# Patient Record
Sex: Female | Born: 1970 | Race: White | Hispanic: No | Marital: Married | Smoking: Never smoker
Health system: Southern US, Community
[De-identification: ages and names within clinical notes are randomized; demographics above are authoritative.]

## PROBLEM LIST (undated history)

## (undated) DIAGNOSIS — M797 Fibromyalgia: Secondary | ICD-10-CM

## (undated) HISTORY — PX: OOPHORECTOMY: SHX86

## (undated) HISTORY — PX: INTRAUTERINE DEVICE (IUD) INSERTION: SHX5877

## (undated) HISTORY — PX: FACIAL RECONSTRUCTION SURGERY: SHX631

---

## 1999-01-25 ENCOUNTER — Inpatient Hospital Stay (HOSPITAL_COMMUNITY): Admission: AD | Admit: 1999-01-25 | Discharge: 1999-01-27 | Payer: Self-pay | Admitting: Obstetrics & Gynecology

## 2000-02-03 ENCOUNTER — Other Ambulatory Visit: Admission: RE | Admit: 2000-02-03 | Discharge: 2000-02-03 | Payer: Self-pay | Admitting: *Deleted

## 2001-02-16 ENCOUNTER — Other Ambulatory Visit: Admission: RE | Admit: 2001-02-16 | Discharge: 2001-02-16 | Payer: Self-pay | Admitting: *Deleted

## 2002-04-24 ENCOUNTER — Other Ambulatory Visit: Admission: RE | Admit: 2002-04-24 | Discharge: 2002-04-24 | Payer: Self-pay | Admitting: *Deleted

## 2004-10-16 ENCOUNTER — Other Ambulatory Visit: Admission: RE | Admit: 2004-10-16 | Discharge: 2004-10-16 | Payer: Self-pay | Admitting: Obstetrics and Gynecology

## 2006-02-09 ENCOUNTER — Other Ambulatory Visit: Admission: RE | Admit: 2006-02-09 | Discharge: 2006-02-09 | Payer: Self-pay | Admitting: Family Medicine

## 2009-12-17 ENCOUNTER — Ambulatory Visit: Payer: Self-pay | Admitting: Gynecology

## 2010-01-15 ENCOUNTER — Ambulatory Visit
Admission: RE | Admit: 2010-01-15 | Discharge: 2010-01-15 | Payer: Self-pay | Source: Home / Self Care | Attending: Gynecology | Admitting: Gynecology

## 2010-01-20 ENCOUNTER — Ambulatory Visit
Admission: RE | Admit: 2010-01-20 | Discharge: 2010-01-20 | Payer: Self-pay | Source: Home / Self Care | Attending: Gynecology | Admitting: Gynecology

## 2010-01-20 HISTORY — PX: PELVIC LAPAROSCOPY: SHX162

## 2010-01-23 NOTE — Op Note (Signed)
NAMECORIANNA, AVALLONE            ACCOUNT NO.:  [id number]  MEDICAL RECORD NO.:  [id number]          PATIENT TYPE:  AMB  LOCATION:  [facility]                         FACILITY:  [facility]  PHYSICIAN:  Juan H. Lily Peer, M.D.DATE OF BIRTH:  11/16/1970  DATE OF PROCEDURE: DATE OF DISCHARGE:                              OPERATIVE REPORT   SURGEON:  Juan H. Lily Peer, M.D.  FIRST ASSISTANT:  Timothy P. Fontaine, M.D.  INDICATION FOR OPERATION:  [age]-year-old gravida 2, para 2 with a left adnexal mass.  PREOPERATIVE DIAGNOSIS:  Left adnexal mass.  POSTOPERATIVE DIAGNOSIS:  Left dermoid cyst.  ANESTHESIA:  General endotracheal anesthesia.  PROCEDURES PERFORMED: 1. Diagnostic laparoscopy. 2. Pelvic washing. 3. Left salpingo-oophorectomy.  FINDINGS:  Patient with an 8-1/2 cm smooth surface cyst, freely mobile with no excrescences.  Anterior and posterior cul-de-sacs were free of adhesions or endometriotic implants.  Her right tube and ovary were otherwise normal.  DESCRIPTION OF OPERATION:  The patient was adequately counseled.  She was taken to the operating room where she underwent a successful general endotracheal anesthesia.  Patient had received a gram of Ancef IV.  She was placed in low lithotomy position.  The abdomen, vagina and perineum were prepped and draped in the usual sterile fashion.  An Hulka tenaculum was placed for manipulation during the laparoscopic procedure. A Foley catheter had also been inserted to respond to urinary output.  A small subumbilical incision was made in a semilunar fashion and the Optiview 10-04/11 mm trocar was introduced into the abdominal cavity. After adequate entrance into the peritoneal cavity, pneumoperitoneum was established whereby 2.5 to 3 liters of carbon dioxide were insufflated into the peritoneal cavity.  Under laparoscopic guidance, two additional 5-mm ports were made in the patient's right and left lower abdomen under laparoscopic  guidance.  She was then placed in Trendelenburg position after pelvic washings had been obtained and inspection of the entire pelvic cavity demonstrating normal anterior and posterior cul-de-sac, normal right tube and ovary and a left dermoid cyst approximately 8.5 cm in diameter with no excrescences and smooth.  The left ureter was identified and the left tube and ovary was placed on detention and the left infundibulopelvic ligament was identified and with Harmonic scalpel, the left infundibulopelvic ligament was coapted and transected. The left utero-ovarian ligament was coapted and transected as well as the proximal fallopian tube and the remainder of the mesosalpinx was coapted and transected to free the left tube and ovary.  Attempt was made to aspirate the cyst content and milky like material, for approximately 15 mL was aspirated and sent off for cytological evaluation.  The 5-mm hysteroscope was inserted in the patient's right lower port and the endoscopic retrieval basket was introduced through the center port and under laparoscopic guidance, the left tube and ovary were placed into the Endopouch and retrieved through the umbilical incision and submitted for histological evaluation.  The pelvic cavity was then copiously irrigated with normal saline solution for approximately a liter to dilute any of the mucinous material and the mesosalpinx small area was bleeding and was contained with the Kleppinger forceps and cauterization and Surgicel was placed for  additional hemostasis.  After the pressure was released, there was no evidence of any bleeding.  The pneumoperitoneum was released and both 5- mm trocars were removed as well as the subumbilical trocar as well.  The patient was then straightened from her Trendelenburg position.  The subumbilical fascia was closed with the running stitch of 3-0 Vicryl suture.  The subcutaneous tissue was reapproximated with 3-0 Vicryl suture as  well and the skin was reapproximated with interrupted sutures of 4-0 plain catgut suture as well as the 5 mm ports.  For postoperative analgesia, 0.25% Marcaine was infiltrated in all three port sites for a total of 10 mL and pressure dressings were placed in all three port sites and the Hulka tenaculum was removed.  The patient was extubated and transferred to recovery room with stable vital signs.  Blood loss from procedure was minimal.  IV fluids have been 100 mL of lactated Ringer's.  Urine output was 200 mL and clear.  She received a gram of Ancef 1 gram IV.     Juan H. Lily Peer, M.D.     JHF/MEDQ  D:  01/20/2010  T:  01/20/2010  Job:  [id number]  Electronically Signed by Reynaldo Minium M.D. on 01/23/2010 05:15:09 PM

## 2010-02-03 ENCOUNTER — Ambulatory Visit
Admission: RE | Admit: 2010-02-03 | Discharge: 2010-02-03 | Payer: Self-pay | Source: Home / Self Care | Attending: Gynecology | Admitting: Gynecology

## 2010-03-04 ENCOUNTER — Encounter (INDEPENDENT_AMBULATORY_CARE_PROVIDER_SITE_OTHER): Payer: BC Managed Care – PPO | Admitting: Gynecology

## 2010-03-04 ENCOUNTER — Other Ambulatory Visit (HOSPITAL_COMMUNITY)
Admission: RE | Admit: 2010-03-04 | Discharge: 2010-03-04 | Disposition: A | Discharge status: Home / Self Care | Payer: BC Managed Care – PPO | Source: Ambulatory Visit | Attending: Gynecology | Admitting: Gynecology

## 2010-03-04 ENCOUNTER — Other Ambulatory Visit: Payer: Self-pay | Admitting: Gynecology

## 2010-03-04 DIAGNOSIS — N83209 Unspecified ovarian cyst, unspecified side: Secondary | ICD-10-CM

## 2010-03-04 DIAGNOSIS — Z124 Encounter for screening for malignant neoplasm of cervix: Secondary | ICD-10-CM | POA: Insufficient documentation

## 2010-03-31 ENCOUNTER — Other Ambulatory Visit (INDEPENDENT_AMBULATORY_CARE_PROVIDER_SITE_OTHER): Payer: BC Managed Care – PPO

## 2010-03-31 DIAGNOSIS — Z13220 Encounter for screening for lipoid disorders: Secondary | ICD-10-CM

## 2010-03-31 DIAGNOSIS — Z833 Family history of diabetes mellitus: Secondary | ICD-10-CM

## 2010-03-31 DIAGNOSIS — R635 Abnormal weight gain: Secondary | ICD-10-CM

## 2011-03-06 ENCOUNTER — Encounter: Payer: BC Managed Care – PPO | Admitting: Gynecology

## 2011-03-18 ENCOUNTER — Encounter: Payer: BC Managed Care – PPO | Admitting: Gynecology

## 2011-03-31 ENCOUNTER — Other Ambulatory Visit: Payer: Self-pay | Admitting: Gynecology

## 2011-03-31 ENCOUNTER — Encounter: Payer: Self-pay | Admitting: Gynecology

## 2011-03-31 ENCOUNTER — Ambulatory Visit (INDEPENDENT_AMBULATORY_CARE_PROVIDER_SITE_OTHER): Payer: BC Managed Care – PPO | Admitting: Gynecology

## 2011-03-31 VITALS — BP 128/80 | Ht 65.75 in | Wt 174.0 lb

## 2011-03-31 DIAGNOSIS — N926 Irregular menstruation, unspecified: Secondary | ICD-10-CM

## 2011-03-31 DIAGNOSIS — N938 Other specified abnormal uterine and vaginal bleeding: Secondary | ICD-10-CM | POA: Insufficient documentation

## 2011-03-31 DIAGNOSIS — Z01419 Encounter for gynecological examination (general) (routine) without abnormal findings: Secondary | ICD-10-CM

## 2011-03-31 DIAGNOSIS — L659 Nonscarring hair loss, unspecified: Secondary | ICD-10-CM

## 2011-03-31 DIAGNOSIS — R635 Abnormal weight gain: Secondary | ICD-10-CM | POA: Insufficient documentation

## 2011-03-31 LAB — URINALYSIS W MICROSCOPIC + REFLEX CULTURE
Crystals: NONE SEEN
Ketones, ur: NEGATIVE mg/dL
Nitrite: NEGATIVE
Specific Gravity, Urine: 1.025 (ref 1.005–1.030)
Urobilinogen, UA: 0.2 mg/dL (ref 0.0–1.0)

## 2011-03-31 MED ORDER — MEGESTROL ACETATE 40 MG PO TABS: 40.0000 mg | ORAL_TABLET | Freq: Two times a day (BID) | ORAL | Status: DC

## 2011-03-31 NOTE — Patient Instructions (Signed)

## 2011-03-31 NOTE — Progress Notes (Signed)
Lindsey Chan 1970-07-06 [id number]   History:    41 y.o.  for annual exam gravida 2 para 2 who presented to the office today for her annual gynecological examination and had complained of dysfunction uterine bleeding. Her husband has had a vasectomy. She stated she's been spotting on and off now for the past month. Her last mammogram February 2012 which was normal. She does her monthly self breast examination.  Past medical history,surgical history, family history and social history were all reviewed and documented in the EPIC chart.  Gynecologic History Patient's last menstrual period was 03/02/2011. Contraception: vasectomy Last Pap: 2012. Results were: normal Last mammogram: 2012. Results were: normal  Obstetric History OB History    Grav Para Term Preterm Abortions TAB SAB Ect Mult Living   2 2 2       2      # Outc Date GA Lbr Len/2nd Wgt Sex Del Anes PTL Lv   1 TRM     F SVD  No Yes   2 TRM     F SVD  No Yes       ROS:  Was performed and pertinent positives and negatives are included in the history.  Exam: chaperone present  BP 128/80  Ht 5' 5.75" (1.67 m)  Wt 174 lb (78.926 kg)  BMI 28.30 kg/m2  LMP 03/02/2011  Body mass index is 28.30 kg/(m^2).  General appearance : Well developed well nourished female. No acute distress HEENT: Neck supple, trachea midline, no carotid bruits, no thyroidmegaly Lungs: Clear to auscultation, no rhonchi or wheezes, or rib retractions  Heart: Regular rate and rhythm, no murmurs or gallops Breast:Examined in sitting and supine position were symmetrical in appearance, no palpable masses or tenderness,  no skin retraction, no nipple inversion, no nipple discharge, no skin discoloration, no axillary or supraclavicular lymphadenopathy Abdomen: no palpable masses or tenderness, no rebound or guarding Extremities: no edema or skin discoloration or tenderness  Pelvic:  Bartholin, Urethra, Skene Glands: Within normal limits  Vagina: No gross lesions or discharge  Cervix: No gross lesions or discharge  Uterus  anteverted, normal size, shape and consistency, non-tender and mobile  Adnexa  Without masses or tenderness  Anus and perineum  normal   Rectovaginal  normal sphincter tone without palpated masses or tenderness             Hemoccult not done   The patient was counseled for an endometrial biopsy. The cervix was cleansed with Betadine solution a Pipelle was introduced into the intrauterine cavity. The uterus sounded to 8 cm and moderate amount of tissue was obtained and submitted for histological evaluation. Of note urine pregnancy test was negative.  Assessment/Plan:  41 y.o. female for annual exam with intermenstrual bleeding. Endometrial biopsy done today pathology report pending at time of this dictation. Patient will be prescribed Megace 40 mg twice a day for 5-7 days if she begins to bleed again. She'll return back next week for sonohysterogram to rule out any intracavitary defects. Requisition to schedule her mammogram was provided. She'll stop by the lab to get the following lab work: TSH, blood sugar, cholesterol, CBC, and urinalysis. We discussed a new Pap smear screening guidelines and she would need 1 for 2 more years.    Ok Edwards MD, 4:30 PM 03/31/2011

## 2011-04-01 LAB — CBC WITH DIFFERENTIAL/PLATELET
Basophils Relative: 1 % (ref 0–1)
Hemoglobin: 11.1 g/dL — ABNORMAL LOW (ref 12.0–15.0)
Lymphocytes Relative: 26 % (ref 12–46)
Lymphs Abs: 2.1 10*3/uL (ref 0.7–4.0)
Monocytes Relative: 8 % (ref 3–12)
Neutro Abs: 4.9 10*3/uL (ref 1.7–7.7)
Neutrophils Relative %: 60 % (ref 43–77)
RBC: 4.32 MIL/uL (ref 3.87–5.11)
WBC: 8.1 10*3/uL (ref 4.0–10.5)

## 2011-04-01 LAB — GLUCOSE, RANDOM: Glucose, Bld: 90 mg/dL (ref 70–99)

## 2011-04-01 LAB — TSH: TSH: 3.266 u[IU]/mL (ref 0.350–4.500)

## 2011-04-01 LAB — CHOLESTEROL, TOTAL: Cholesterol: 147 mg/dL (ref 0–200)

## 2011-04-02 LAB — URINE CULTURE
Colony Count: NO GROWTH
Organism ID, Bacteria: NO GROWTH

## 2011-04-06 ENCOUNTER — Telehealth: Payer: Self-pay | Admitting: *Deleted

## 2011-04-06 NOTE — Telephone Encounter (Signed)
Pt called asking when should she take the megace 40 mg given on last office visit 03/31/11. Pt informed per office note take medication once bleeding starts.

## 2011-04-09 ENCOUNTER — Telehealth: Payer: Self-pay | Admitting: *Deleted

## 2011-04-09 NOTE — Telephone Encounter (Signed)
Pt informed with the below note. 

## 2011-04-09 NOTE — Telephone Encounter (Signed)
Pt has [facility] scheduled on 04/10/11, she was given Megace 40 mg twice a day for 5-7 days if she begins to bleed again. Bleeding starting on Tuesday afternoon mild flow. Pt wants to know if you want her to keep appointment or reschedule?

## 2011-04-09 NOTE — Telephone Encounter (Signed)
If she has any Megace left tell her to take 1 tablet twice a day for 5 more days if not we'll call in for her. She still should come in tomorrow for her sonohysterogram

## 2011-04-10 ENCOUNTER — Ambulatory Visit (INDEPENDENT_AMBULATORY_CARE_PROVIDER_SITE_OTHER): Payer: BC Managed Care – PPO

## 2011-04-10 ENCOUNTER — Other Ambulatory Visit: Payer: BC Managed Care – PPO

## 2011-04-10 ENCOUNTER — Ambulatory Visit: Payer: BC Managed Care – PPO | Admitting: Gynecology

## 2011-04-10 ENCOUNTER — Ambulatory Visit (INDEPENDENT_AMBULATORY_CARE_PROVIDER_SITE_OTHER): Payer: BC Managed Care – PPO | Admitting: Gynecology

## 2011-04-10 DIAGNOSIS — R1032 Left lower quadrant pain: Secondary | ICD-10-CM

## 2011-04-10 DIAGNOSIS — N938 Other specified abnormal uterine and vaginal bleeding: Secondary | ICD-10-CM

## 2011-04-10 DIAGNOSIS — N939 Abnormal uterine and vaginal bleeding, unspecified: Secondary | ICD-10-CM

## 2011-04-10 DIAGNOSIS — N840 Polyp of corpus uteri: Secondary | ICD-10-CM

## 2011-04-10 DIAGNOSIS — N926 Irregular menstruation, unspecified: Secondary | ICD-10-CM

## 2011-04-10 NOTE — Progress Notes (Signed)
Patient is a 41 year old gravida 2 para 2 who was seen in the office on March 26 at time of her annual exam she did complain dysfunction uterine bleeding her husband had a vasectomy. She had an endometrial biopsy done during that office visit with pathology report as follows:  Endometrium, biopsy SECRETORY ENDOMETRIUM AND BENIGN ENDOMETRIAL POLYP. NO HYPERPLASIA OR CARCINOMA.  Patient presented to the office today for sonohysterogram for further evaluation. Ultrasound report as follows:  Uterus measured 10.4 x 6.0 x 5.6 cm with endometrial stripe 18.3 mm (patient currently on menses). Right and left adnexa otherwise normal several polyps were noted as follows: 17 x 10 mm, 21 x 10 mm, non-by 5 mm.  The above findings were discussed with the patient she is going to go on vacation this month. We'll schedule for a resectoscopic polypectomy and endometrial ablation 2 weeks after the start of her cycle in May. We'll place her on Prometrium 200 mg one by mouth daily before the procedure. She will see me in the office the week before surgery for preoperative consultation. Literature information was provided and we'll follow accordingly.

## 2011-04-13 ENCOUNTER — Telehealth: Payer: Self-pay

## 2011-04-13 NOTE — Telephone Encounter (Signed)
Left message patient to call me to see if she when she anticipates May period so we can consider scheduling her outpatient surgery.

## 2011-04-20 ENCOUNTER — Telehealth: Payer: Self-pay | Admitting: *Deleted

## 2011-04-20 DIAGNOSIS — N938 Other specified abnormal uterine and vaginal bleeding: Secondary | ICD-10-CM

## 2011-04-20 MED ORDER — MEGESTROL ACETATE 40 MG PO TABS: 40.0000 mg | ORAL_TABLET | Freq: Two times a day (BID) | ORAL | Status: DC

## 2011-04-20 MED ORDER — HYDROCODONE-ACETAMINOPHEN 5-500 MG PO TABS: ORAL_TABLET | ORAL | Status: DC

## 2011-04-20 NOTE — Telephone Encounter (Signed)
Spoke with pt regarding the below. Will pt be on megace 40 mg daily twice daily?

## 2011-04-20 NOTE — Telephone Encounter (Signed)
Pt is to a resectoscopic polypectomy and endometrial ablation 2 weeks after the start of her cycle in May. Pt is c/o bleeding an cramping, lmp:04/07/11 she will be leaving out of town for [place] for 1 week. Pt was giving megace 40 mg twice daily. Pt would like to have something to relieve the pain & cramping. She also wanted to know if her surgery could be pushed to earlier date, I told pt i wasn't sure if this was possible, she told me to ask you. Please advise

## 2011-04-20 NOTE — Telephone Encounter (Signed)
Please tell patient to contact Lindsey Chan to see if we can remove her ablation before. We did call in the Megace to help with her bleeding. For her cramps she can take Lortab 5/501 by mouth Q4 to 6 hours when necessary #30 no refills

## 2011-04-20 NOTE — Telephone Encounter (Signed)
rx sent, Lortab 5/500 called in.

## 2011-04-20 NOTE — Telephone Encounter (Signed)
Megace 40MG  BID for 10 days # 20

## 2011-04-23 ENCOUNTER — Telehealth: Payer: Self-pay | Admitting: Gynecology

## 2011-04-23 DIAGNOSIS — N938 Other specified abnormal uterine and vaginal bleeding: Secondary | ICD-10-CM

## 2011-04-23 MED ORDER — MEGESTROL ACETATE 40 MG PO TABS: 40.0000 mg | ORAL_TABLET | Freq: Two times a day (BID) | ORAL | Status: DC

## 2011-04-23 NOTE — Telephone Encounter (Signed)
Patient given Dr. Glenetta Hew instructions below regarding Lindsey Chan.  I did not see RX in system so I added a refill to her RX.  I told patient that surgery will be 5/2 or 5/3 but I am still trying to coordinate it and will call her in the morning to confirm date/time. (Waiting to hear from [facility] rep regarding his availability.)

## 2011-04-23 NOTE — Telephone Encounter (Signed)
You had asked me to schedule Resectoscopic Polypectomy and Vaportrode Ablation.  Your order said 2nd week of May because you thought her period would start first week and you wanted her on Prometrium x 2 wks prior.    Meantime, she took Megace for one week Apr 1-5 for bleeding. Had [facility] on 4/5 and you told her to d/c the Megace at that appt.  She has spotted or bled every day since. She said this week is heavier with cramps and might possibly be her actual period.  However, she is leaving for [facility] Saturday until the following Saturday.  Husband with a vasectomy and she said pregnancy not a concern.  She has Megace on hand that she was saving in case her bleeding became terrible while at [facility].   We are wanting to schedule her on the Thursday or Friday of the week she returns (May 2 or May 3).  Wanted to be sure that sounds okay with you.  Also, do you still want to do  Prometrium for two weeks before?  She needs to be sure whatever she is on will help slow her flow if it is heavy while away.  Pls advise and thanks.

## 2011-04-23 NOTE — Telephone Encounter (Signed)
Patient can stay on Megace 40 mg twice a day until she stops bleeding and then she can take 1 daily until her surgery and will not need the Prometrium then. If she starts bleeding on 1 tablet daily she should just go back to one tablet twice a day until surgery. Prescribed 30 one refill

## 2011-05-04 ENCOUNTER — Encounter (HOSPITAL_COMMUNITY): Payer: Self-pay | Admitting: Pharmacist

## 2011-05-05 ENCOUNTER — Ambulatory Visit (INDEPENDENT_AMBULATORY_CARE_PROVIDER_SITE_OTHER): Payer: BC Managed Care – PPO | Admitting: Gynecology

## 2011-05-05 ENCOUNTER — Encounter: Payer: Self-pay | Admitting: Gynecology

## 2011-05-05 VITALS — BP 120/80

## 2011-05-05 DIAGNOSIS — Z01818 Encounter for other preprocedural examination: Secondary | ICD-10-CM

## 2011-05-05 NOTE — Progress Notes (Signed)
Patient ID: Lei Guglielmo, female   DOB: 10/30/1970, 41 y.o.   MRN: [id number] Addylynn Segreto is a 41-year-old gravida 2 para 2 who presented to the office today for preoperative consultation who was seen in the office on March 26 at time of her annual exam she had been complaining of dysfunctional  uterine bleeding ( her husband had a vasectomy). She had an endometrial biopsy done during that office visit with pathology report as follows:   Endometrium, biopsy  SECRETORY ENDOMETRIUM AND BENIGN ENDOMETRIAL POLYP. NO HYPERPLASIA OR  CARCINOMA.   Patient had a sonohysterogram on 04/10/2011 with the following findings:   Uterus measured 10.4 x 6.0 x 5.6 cm with endometrial stripe 18.3 mm (patient currently on menses). Right and left adnexa otherwise normal several polyps were noted as follows: 17 x 10 mm, 21 x 10 mm, non-by 5 mm.  Patient scheduled to undergo resectoscopic polypectomy and endometrial ablation on Thursday, May 2   Pertinent Gynecological History: Menses: See above Bleeding: intermenstrual bleeding Contraception: vasectomy DES exposure: denies Blood transfusions: none Sexually transmitted diseases: no past history Previous GYN Procedures: Laparoscopic left salpingo-oophorectomy for mature teratoma  Last mammogram: ? Date: ? Last pap: normal Date: 2012 OB History: G2, P2   Menstrual History: Menarche age: 11 Patient's last menstrual period was 04/20/2011.    Past Medical History  Diagnosis Date  . Asthma     Past Surgical History  Procedure Date  . Facial reconstruction surgery     BROKEN CHEEK  . 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 Hypertension Mother     Social History:  reports that she has never smoked. She has never used smokeless tobacco. She reports that she does not drink alcohol or use illicit drugs.  Allergies:  Allergies  Allergen Reactions  . Clarithromycin Shortness Of  Breath  . Naproxen Shortness Of Breath  . Amoxicillin Rash     (Not in a hospital admission)  REVIEW OF SYSTEMS: A ROS was performed and pertinent positives and negatives are included in the history.  GENERAL: No fevers or chills. HEENT: No change in vision, no earache, sore throat or sinus congestion. NECK: No pain or stiffness. CARDIOVASCULAR: No chest pain or pressure. No palpitations. PULMONARY: No shortness of breath, cough or wheeze. GASTROINTESTINAL: No abdominal pain, nausea, vomiting or diarrhea, melena or bright red blood per rectum. GENITOURINARY: No urinary frequency, urgency, hesitancy or dysuria. MUSCULOSKELETAL: No joint or muscle pain, no back pain, no recent trauma. DERMATOLOGIC: No rash, no itching, no lesions. ENDOCRINE: No polyuria, polydipsia, no heat or cold intolerance. No recent change in weight. HEMATOLOGICAL: No anemia or easy bruising or bleeding. NEUROLOGIC: No headache, seizures, numbness, tingling or weakness. PSYCHIATRIC: No depression, no loss of interest in normal activity or change in sleep pattern.     Blood pressure 120/80, last menstrual period 04/20/2011.  Physical Exam:  HEENT:unremarkable Neck:Supple, midline, no thyroid megaly, no carotid bruits Lungs:  Clear to auscultation no rhonchi's or wheezes Heart:Regular rate and rhythm, no murmurs or gallops Breast Exam: Not examined today Abdomen: Soft nontender no rebound guarding Pelvic:BUS within normal limits Vagina: No lesions or discharge Cervix: No lesions or discharge Uterus: Anteverted normal size shape and consistency Adnexa: No palpable masses or tenderness Extremities: No cords, no edema Rectal: Not examined  No results found for this or any previous visit (from the past 24 hour(s)).  No results found.  Assessment/Plan: Patient   scheduled to undergo resectoscopic polypectomy along with endometrial ablation on Thursday, May 2 1 PM the following risks were discussed with the patient:                         Patient was counseled as to the risk of surgery to include the following:  1. Infection (prohylactic antibiotics will be administered)  2. DVT/Pulmonary Embolism (prophylactic pneumo compression stockings will be used)  3.Trauma to internal organs requiring additional surgical procedure to repair any injury to     Internal organs requiring perhaps additional hospitalization days.  4.Hemmorhage requiring transfusion and blood products which carry risks such as             anaphylactic reaction, hepatitis and AIDS  Patient had received literature information on the procedure scheduled and all her questions were answered and accepts all risk.  Thurza Kwiecinski HMD4:44 PMTD@   Jaycie Kregel H 05/05/2011, 4:38 PM    

## 2011-05-05 NOTE — Patient Instructions (Signed)
We will see you on Thursday.

## 2011-05-06 MED ORDER — CIPROFLOXACIN IN D5W 400 MG/200ML IV SOLN
400.0000 mg | INTRAVENOUS | Status: DC
  Filled 2011-05-06: qty 200

## 2011-05-06 MED ORDER — CLINDAMYCIN PHOSPHATE 900 MG/50ML IV SOLN
900.0000 mg | INTRAVENOUS | Status: DC
  Filled 2011-05-06: qty 50

## 2011-05-07 ENCOUNTER — Encounter (HOSPITAL_COMMUNITY)
Admission: RE | Disposition: A | Discharge status: Home / Self Care | Payer: Self-pay | Source: Ambulatory Visit | Attending: Gynecology

## 2011-05-07 ENCOUNTER — Encounter (HOSPITAL_COMMUNITY): Payer: Self-pay | Admitting: Anesthesiology

## 2011-05-07 ENCOUNTER — Encounter (HOSPITAL_COMMUNITY): Payer: Self-pay | Admitting: *Deleted

## 2011-05-07 ENCOUNTER — Ambulatory Visit (HOSPITAL_COMMUNITY)
Admission: RE | Admit: 2011-05-07 | Discharge: 2011-05-07 | Disposition: A | Discharge status: Home / Self Care | Payer: BC Managed Care – PPO | Source: Ambulatory Visit | Attending: Gynecology | Admitting: Gynecology

## 2011-05-07 ENCOUNTER — Ambulatory Visit (HOSPITAL_COMMUNITY): Payer: BC Managed Care – PPO | Admitting: Anesthesiology

## 2011-05-07 DIAGNOSIS — N840 Polyp of corpus uteri: Secondary | ICD-10-CM

## 2011-05-07 DIAGNOSIS — N938 Other specified abnormal uterine and vaginal bleeding: Secondary | ICD-10-CM | POA: Insufficient documentation

## 2011-05-07 DIAGNOSIS — N949 Unspecified condition associated with female genital organs and menstrual cycle: Secondary | ICD-10-CM

## 2011-05-07 DIAGNOSIS — N920 Excessive and frequent menstruation with regular cycle: Secondary | ICD-10-CM | POA: Insufficient documentation

## 2011-05-07 DIAGNOSIS — R635 Abnormal weight gain: Secondary | ICD-10-CM

## 2011-05-07 LAB — URINALYSIS, ROUTINE W REFLEX MICROSCOPIC
Bilirubin Urine: NEGATIVE
Hgb urine dipstick: NEGATIVE
Protein, ur: NEGATIVE mg/dL
Urobilinogen, UA: 0.2 mg/dL (ref 0.0–1.0)

## 2011-05-07 LAB — CBC
HCT: 38 % (ref 36.0–46.0)
MCHC: 31.8 g/dL (ref 30.0–36.0)
MCV: 81 fL (ref 78.0–100.0)
Platelets: 222 10*3/uL (ref 150–400)
RDW: 14.7 % (ref 11.5–15.5)

## 2011-05-07 SURGERY — DILATATION & CURETTAGE/HYSTEROSCOPY WITH RESECTOCOPE
Anesthesia: General | Wound class: Clean Contaminated

## 2011-05-07 MED ORDER — LACTATED RINGERS IV SOLN
INTRAVENOUS | Status: DC | PRN
  Administered 2011-05-07: 12:00:00 via INTRAVENOUS

## 2011-05-07 MED ORDER — FENTANYL CITRATE 0.05 MG/ML IJ SOLN
INTRAMUSCULAR | Status: AC
  Administered 2011-05-07: 50 ug via INTRAVENOUS
  Filled 2011-05-07: qty 2

## 2011-05-07 MED ORDER — SODIUM CHLORIDE 0.9 % IR SOLN
Status: DC | PRN
  Administered 2011-05-07: 3000 mL

## 2011-05-07 MED ORDER — LIDOCAINE HCL (CARDIAC) 20 MG/ML IV SOLN
INTRAVENOUS | Status: AC
  Filled 2011-05-07: qty 5

## 2011-05-07 MED ORDER — GLYCINE 1.5 % IR SOLN
Status: DC | PRN
  Administered 2011-05-07: 3000 mL

## 2011-05-07 MED ORDER — LACTATED RINGERS IV SOLN
INTRAVENOUS | Status: DC
  Administered 2011-05-07: 1000 mL via INTRAVENOUS
  Administered 2011-05-07: 12:00:00 via INTRAVENOUS

## 2011-05-07 MED ORDER — CIPROFLOXACIN IN D5W 400 MG/200ML IV SOLN
INTRAVENOUS | Status: DC | PRN
  Administered 2011-05-07: 400 mg via INTRAVENOUS

## 2011-05-07 MED ORDER — MIDAZOLAM HCL 5 MG/5ML IJ SOLN
INTRAMUSCULAR | Status: DC | PRN
  Administered 2011-05-07: 2 mg via INTRAVENOUS

## 2011-05-07 MED ORDER — LIDOCAINE HCL (CARDIAC) 20 MG/ML IV SOLN
INTRAVENOUS | Status: DC | PRN
  Administered 2011-05-07: 60 mg via INTRAVENOUS

## 2011-05-07 MED ORDER — FENTANYL CITRATE 0.05 MG/ML IJ SOLN
INTRAMUSCULAR | Status: DC | PRN
  Administered 2011-05-07 (×2): 50 ug via INTRAVENOUS

## 2011-05-07 MED ORDER — PROPOFOL 10 MG/ML IV EMUL
INTRAVENOUS | Status: AC
  Filled 2011-05-07: qty 20

## 2011-05-07 MED ORDER — PROPOFOL 10 MG/ML IV EMUL
INTRAVENOUS | Status: DC | PRN
  Administered 2011-05-07: 150 mg via INTRAVENOUS

## 2011-05-07 MED ORDER — METOCLOPRAMIDE HCL 5 MG/ML IJ SOLN: 10.0000 mg | Freq: Once | INTRAMUSCULAR | Status: DC | PRN

## 2011-05-07 MED ORDER — KETOROLAC TROMETHAMINE 30 MG/ML IJ SOLN
INTRAMUSCULAR | Status: AC
Start: 2011-05-07 — End: 2011-05-07
  Filled 2011-05-07: qty 1

## 2011-05-07 MED ORDER — MIDAZOLAM HCL 2 MG/2ML IJ SOLN
INTRAMUSCULAR | Status: AC
  Filled 2011-05-07: qty 2

## 2011-05-07 MED ORDER — ONDANSETRON HCL 4 MG/2ML IJ SOLN
INTRAMUSCULAR | Status: AC
  Filled 2011-05-07: qty 2

## 2011-05-07 MED ORDER — ONDANSETRON HCL 4 MG/2ML IJ SOLN
INTRAMUSCULAR | Status: DC | PRN
  Administered 2011-05-07: 4 mg via INTRAVENOUS

## 2011-05-07 MED ORDER — FENTANYL CITRATE 0.05 MG/ML IJ SOLN
25.0000 ug | INTRAMUSCULAR | Status: DC | PRN
  Administered 2011-05-07: 50 ug via INTRAVENOUS

## 2011-05-07 MED ORDER — KETOROLAC TROMETHAMINE 60 MG/2ML IM SOLN
INTRAMUSCULAR | Status: DC | PRN
  Administered 2011-05-07: 30 mg via INTRAMUSCULAR

## 2011-05-07 MED ORDER — MEPERIDINE HCL 25 MG/ML IJ SOLN: 6.2500 mg | INTRAMUSCULAR | Status: DC | PRN

## 2011-05-07 MED ORDER — SILVER NITRATE-POT NITRATE 75-25 % EX MISC
CUTANEOUS | Status: AC
  Filled 2011-05-07: qty 3

## 2011-05-07 MED ORDER — FENTANYL CITRATE 0.05 MG/ML IJ SOLN
INTRAMUSCULAR | Status: AC
  Filled 2011-05-07: qty 2

## 2011-05-07 MED ORDER — DEXAMETHASONE SODIUM PHOSPHATE 4 MG/ML IJ SOLN
INTRAMUSCULAR | Status: DC | PRN
  Administered 2011-05-07: 10 mg via INTRAVENOUS

## 2011-05-07 SURGICAL SUPPLY — 22 items
CANISTER SUCTION 2500CC (MISCELLANEOUS) ×5 IMPLANT
CATH ROBINSON RED A/P 16FR (CATHETERS) ×3 IMPLANT
CLOTH BEACON ORANGE TIMEOUT ST (SAFETY) ×3 IMPLANT
CONTAINER PREFILL 10% NBF 60ML (FORM) ×6 IMPLANT
CORD ACTIVE DISPOSABLE (ELECTRODE) ×1
CORD ELECTRO ACTIVE DISP (ELECTRODE) ×2 IMPLANT
ELECT LOOP GYNE PRO 24FR (CUTTING LOOP) ×3
ELECT REM PT RETURN 9FT ADLT (ELECTROSURGICAL) ×3
ELECT VAPORTRODE GRVD BAR (ELECTRODE) ×1 IMPLANT
ELECTRODE LOOP GYNE PRO 24FR (CUTTING LOOP) IMPLANT
ELECTRODE REM PT RTRN 9FT ADLT (ELECTROSURGICAL) ×2 IMPLANT
ELECTRODE RT ANGLE VERSAPOINT (CUTTING LOOP) ×1 IMPLANT
GLOVE BIOGEL PI IND STRL 8 (GLOVE) ×2 IMPLANT
GLOVE BIOGEL PI INDICATOR 8 (GLOVE) ×1
GLOVE ECLIPSE 7.5 STRL STRAW (GLOVE) ×6 IMPLANT
GOWN PREVENTION PLUS LG XLONG (DISPOSABLE) ×3 IMPLANT
GOWN STRL REIN XL XLG (GOWN DISPOSABLE) ×3 IMPLANT
MORCELLATOR RECIP TRUCLEAR 4.0 (ABLATOR) ×1 IMPLANT
PACK HYSTEROSCOPY LF (CUSTOM PROCEDURE TRAY) ×3 IMPLANT
PAD PREP 24X48 CUFFED NSTRL (MISCELLANEOUS) ×3 IMPLANT
TOWEL OR 17X24 6PK STRL BLUE (TOWEL DISPOSABLE) ×6 IMPLANT
WATER STERILE IRR 1000ML POUR (IV SOLUTION) ×3 IMPLANT

## 2011-05-07 NOTE — Discharge Instructions (Signed)
Hysteroscopy Hysteroscopy is a procedure used for looking inside the womb (uterus). It may be done for many different reasons, including: AFTER THE PROCEDURE   If you had a general anesthetic, you may be groggy for a couple hours after the procedure.   If you had a local anesthetic, you will be advised to rest at the surgical center or caregiver's office until you are stable and feel ready to go home.   You may have some cramping for a couple days.   You may have bleeding, which varies from light spotting for a few days to menstrual-like bleeding for up to 3 to 7 days. This is normal.   Have someone take you home.    FINDING OUT THE RESULTS OF YOUR TEST Not all test results are available during your visit. If your test results are not back during the visit, make an appointment with your caregiver to find out the results. Do not assume everything is normal if you have not heard from your caregiver or the medical facility. It is important for you to follow up on all of your test results. HOME CARE INSTRUCTIONS   Do not drive for 24 hours or as instructed.   Only take over-the-counter or prescription medicines for pain, discomfort, or fever as directed by your caregiver.   Do not take aspirin. It can cause or aggravate bleeding.   Do not drive or drink alcohol while taking pain medicine.   You may resume your usual diet.   Do not use tampons, douche, or have sexual intercourse for 2 weeks, or as advised by your caregiver.   Rest and sleep for the first 24 to 48 hours.   Take your temperature twice a day for 4 to 5 days. Write it down. Give these temperatures to your caregiver if they are abnormal (above 98.6 F or 37.0 C).   Take medicines your caregiver has ordered as directed.   Follow your caregiver's advice regarding diet, exercise, lifting, driving, and general activities.   Take showers instead of baths for 2 weeks, or as recommended by your caregiver.   If you develop  constipation:   Take a mild laxative with the advice of your caregiver.   Eat bran foods.   Drink enough water and fluids to keep your urine clear or pale yellow.   Try to have someone with you or available to you for the first 24 to 48 hours, especially if you had a general anesthetic.   Make sure you and your family understand everything about your operation and recovery.   Follow your caregiver's advice regarding follow-up appointments and Pap smears.  SEEK MEDICAL CARE IF:   You feel dizzy or lightheaded.   You feel sick to your stomach (nauseous).   You develop abnormal vaginal discharge.   You develop a rash.   You have an abnormal reaction or allergy to your medicine.   You need stronger pain medicine.  SEEK IMMEDIATE MEDICAL CARE IF:   Bleeding is heavier than a normal menstrual period or you have blood clots.   You have an oral temperature above 100.4, not controlled by medicine.   You have increasing cramps or pains not relieved with medicine.   You develop belly (abdominal) pain that does not seem to be related to the same area of earlier cramping and pain.   You pass out.   You develop pain in the tops of your shoulders (shoulder strap areas).   You develop shortness of breath.  MAKE SURE YOU:   Understand these instructions.   Will watch your condition.   Will get help right away if you are not doing well or get worse.  Document Released: 03/30/2000 Document Revised: 12/11/2010 Document Reviewed: 07/23/2008 [facility] Patient Information 2012 [place], [place].

## 2011-05-07 NOTE — Anesthesia Preprocedure Evaluation (Addendum)
Anesthesia Evaluation  Patient identified by MRN, date of birth, ID band Patient awake    Reviewed: Allergy & Precautions, H&P , NPO status , Patient's Chart, lab work & pertinent test results  Airway Mallampati: II TM Distance: >3 FB Neck ROM: full    Dental No notable dental hx. (+) Teeth Intact   Pulmonary asthma ,    Pulmonary exam normal       Cardiovascular negative cardio ROS  Rhythm:regular Rate:Normal     Neuro/Psych negative neurological ROS  negative psych ROS   GI/Hepatic negative GI ROS, Neg liver ROS,   Endo/Other  negative endocrine ROS  Renal/GU negative Renal ROS  negative genitourinary   Musculoskeletal   Abdominal Normal abdominal exam  (+)   Peds  Hematology negative hematology ROS (+)   Anesthesia Other Findings   Reproductive/Obstetrics negative OB ROS                           Anesthesia Physical Anesthesia Plan  ASA: II  Anesthesia Plan: General LMA   Post-op Pain Management:    Induction:   Airway Management Planned:   Additional Equipment:   Intra-op Plan:   Post-operative Plan:   Informed Consent: I have reviewed the patients History and Physical, chart, labs and discussed the procedure including the risks, benefits and alternatives for the proposed anesthesia with the patient or authorized representative who has indicated his/her understanding and acceptance.   Dental Advisory Given  Plan Discussed with: Anesthesiologist, CRNA and Surgeon  Anesthesia Plan Comments:         Anesthesia Quick Evaluation

## 2011-05-07 NOTE — Anesthesia Postprocedure Evaluation (Signed)
  Anesthesia Post-op Note  Patient: Lindsey Chan  Procedure(s) Performed: Procedure(s) (LRB): DILATATION & CURETTAGE/HYSTEROSCOPY WITH RESECTOCOPE (N/A) DILATATION & CURETTAGE/HYSTEROSCOPY WITH VERSAPOINT RESECTION ()  Patient is awake and responsive. Pain and nausea are reasonably well controlled. Vital signs are stable and clinically acceptable. Oxygen saturation is clinically acceptable. There are no apparent anesthetic complications at this time. Patient is ready for discharge.

## 2011-05-07 NOTE — Transfer of Care (Signed)
Immediate Anesthesia Transfer of Care Note  Patient: Lindsey Chan  Procedure(s) Performed: Procedure(s) (LRB): DILATATION & CURETTAGE/HYSTEROSCOPY WITH RESECTOCOPE (N/A) DILATATION & CURETTAGE/HYSTEROSCOPY WITH VERSAPOINT RESECTION ()  Patient Location: PACU  Anesthesia Type: General  Level of Consciousness: awake, alert , oriented and patient cooperative  Airway & Oxygen Therapy: Patient Spontanous Breathing and Patient connected to nasal cannula oxygen  Post-op Assessment: Report given to PACU RN, Post -op Vital signs reviewed and stable and Patient moving all extremities X 4  Post vital signs: Reviewed and stable  Complications: No apparent anesthesia complications

## 2011-05-07 NOTE — Preoperative (Signed)
Beta Blockers   Reason not to administer Beta Blockers:Not Applicable 

## 2011-05-07 NOTE — H&P (View-Only) (Signed)
Patient ID: Lindsey Chan, female   DOB: 1970/04/25, 41 y.o.   MRN: [id number] Lindsey Chan is a 41 year old gravida 2 para 2 who presented to the office today for preoperative consultation who was seen in the office on March 26 at time of her annual exam she had been complaining of dysfunctional  uterine bleeding ( her husband had a vasectomy). She had an endometrial biopsy done during that office visit with pathology report as follows:   Endometrium, biopsy  SECRETORY ENDOMETRIUM AND BENIGN ENDOMETRIAL POLYP. NO HYPERPLASIA OR  CARCINOMA.   Patient had a sonohysterogram on 04/10/2011 with the following findings:   Uterus measured 10.4 x 6.0 x 5.6 cm with endometrial stripe 18.3 mm (patient currently on menses). Right and left adnexa otherwise normal several polyps were noted as follows: 17 x 10 mm, 21 x 10 mm, non-by 5 mm.  Patient scheduled to undergo resectoscopic polypectomy and endometrial ablation on Thursday, May 2   Pertinent Gynecological History: Menses: See above Bleeding: intermenstrual bleeding Contraception: vasectomy DES exposure: denies Blood transfusions: none Sexually transmitted diseases: no past history Previous GYN Procedures: Laparoscopic left salpingo-oophorectomy for mature teratoma  Last mammogram: ? Date: ? Last pap: normal Date: 2012 OB History: G2, P2   Menstrual History: Menarche age: 30 Patient's last menstrual period was 04/20/2011.    Past Medical History  Diagnosis Date  . Asthma     Past Surgical History  Procedure Date  . Facial reconstruction surgery     BROKEN CHEEK  . 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 Hypertension Mother     Social History:  reports that she has never smoked. She has never used smokeless tobacco. She reports that she does not drink alcohol or use illicit drugs.  Allergies:  Allergies  Allergen Reactions  . Clarithromycin Shortness Of  Breath  . Naproxen Shortness Of Breath  . Amoxicillin Rash     (Not in a hospital admission)  REVIEW OF SYSTEMS: A ROS was performed and pertinent positives and negatives are included in the history.  GENERAL: No fevers or chills. HEENT: No change in vision, no earache, sore throat or sinus congestion. NECK: No pain or stiffness. CARDIOVASCULAR: No chest pain or pressure. No palpitations. PULMONARY: No shortness of breath, cough or wheeze. GASTROINTESTINAL: No abdominal pain, nausea, vomiting or diarrhea, melena or bright red blood per rectum. GENITOURINARY: No urinary frequency, urgency, hesitancy or dysuria. MUSCULOSKELETAL: No joint or muscle pain, no back pain, no recent trauma. DERMATOLOGIC: No rash, no itching, no lesions. ENDOCRINE: No polyuria, polydipsia, no heat or cold intolerance. No recent change in weight. HEMATOLOGICAL: No anemia or easy bruising or bleeding. NEUROLOGIC: No headache, seizures, numbness, tingling or weakness. PSYCHIATRIC: No depression, no loss of interest in normal activity or change in sleep pattern.     Blood pressure 120/80, last menstrual period 04/20/2011.  Physical Exam:  HEENT:unremarkable Neck:Supple, midline, no thyroid megaly, no carotid bruits Lungs:  Clear to auscultation no rhonchi's or wheezes Heart:Regular rate and rhythm, no murmurs or gallops Breast Exam: Not examined today Abdomen: Soft nontender no rebound guarding Pelvic:BUS within normal limits Vagina: No lesions or discharge Cervix: No lesions or discharge Uterus: Anteverted normal size shape and consistency Adnexa: No palpable masses or tenderness Extremities: No cords, no edema Rectal: Not examined  No results found for this or any previous visit (from the past 24 hour(s)).  No results found.  Assessment/Plan: Patient  scheduled to undergo resectoscopic polypectomy along with endometrial ablation on Thursday, May 2 1 PM the following risks were discussed with the patient:                         Patient was counseled as to the risk of surgery to include the following:  1. Infection (prohylactic antibiotics will be administered)  2. DVT/Pulmonary Embolism (prophylactic pneumo compression stockings will be used)  3.Trauma to internal organs requiring additional surgical procedure to repair any injury to     Internal organs requiring perhaps additional hospitalization days.  4.Hemmorhage requiring transfusion and blood products which carry risks such as             anaphylactic reaction, hepatitis and AIDS  Patient had received literature information on the procedure scheduled and all her questions were answered and accepts all risk.  [facility] HMD4:44 PMTD@   Zabdi Mis H 05/05/2011, 4:38 PM

## 2011-05-07 NOTE — Op Note (Signed)
05/07/2011  2:49 PM  PATIENT:  Lindsey Chan  41 y.o. female with dysfunctional uterine bleeding attributed to endometrial polyps. Preoperative endometrial biopsy benign with the following:  SECRETORY ENDOMETRIUM AND BENIGN ENDOMETRIAL POLYP. NO HYPERPLASIA OR CARCINOMA  PRE-OPERATIVE DIAGNOSIS:  ENDOMETRIAL POLYP, MENORRHAGIA  POST-OPERATIVE DIAGNOSIS:  ENDOMETRIAL POLYP, MENORRHAGIA  PROCEDURE:  Procedure(s): DILATATION & CURETTAGE/HYSTEROSCOPY WITH resectoscopic polypectomy with true clear morcellator and endometrial ablation with roller bar (vaporization ACMI electrode)  SURGEON:  Surgeon(s): Ok Edwards, MD  ANESTHESIA:   general  FINDINGS: Multiple intrauterine polyps especially on the posterior uterine wall and anterior lower uterine segment. Both tubal loss he identified smooth endocervical canal. Uterus anteverted and sounded to 8 cm.  DESCRIPTION OF OPERATION: After the patient was counseled and permit was signed she was taken to the operating room where she underwent a successful general endotracheal anesthesia. Due to patient's penicillin allergy she received ciprofloxacin 4 mg IV prophylactically. Patient also had PAS stockings for DVT prophylaxis as well. Patient's vagina was prepped and draped in usual sterile fashion. Bimanual examination demonstrated an anteverted uterus 6-8 weeks size with no palpable adnexal masses. Weighted speculum was placed in the posterior vaginal vault and a Sims retractor anteriorly for exposure. A single-tooth tenaculum was placed on the anterior cervical lip. The uterus sounded to 8 cm. The Pratt dilators were utilized to dilate the cervix. For the first portion of the procedure normal saline was the distending media and the operative resectoscope with true clear morcellator attachment with a 2.9 mm in size was attached and inserted into the intrauterine cavity. A systematic inspection demonstrated several posterior uterine wall endometrial  polyps and a few scattered on the anterior lower uterine segment. The polyps were respectively morcellated and specimen submitted for histological evaluation. Once this portion of the operation was completed the distending media was then changed to 1.5% glycine and the ACMI vaporization rollerball electrode was attached to the operating elements and then the endometrial cavity was ablated in a systematic fashion from the fundus to the level of the internal cervical loss. Pre-and post procedure pictures obtained. Patient was extubated and transferred to recovery stable vital signs. Blood loss was minimal. Patient received 30 mg of Toradol IV in route to the recovery room. Fluid deficit 360 cc for the first portion of the operation with normal saline and 520 cc fluid deficit in the second portion of the procedure with glycine.  ESTIMATED BLOOD LOSS: Minimal  Intake/Output Summary (Last 24 hours) at 05/07/11 1449 Last data filed at 05/07/11 1422  Gross per 24 hour  Intake    800 ml  Output     10 ml  Net    790 ml     BLOOD ADMINISTERED:none   LOCAL MEDICATIONS USED:  NONE  SPECIMEN:  Source of Specimen:  Uterine polyps  DISPOSITION OF SPECIMEN:  PATHOLOGY  COUNTS:  YES  PLAN OF CARE: Transfer to PACU  [facility] HMD2:49 PMTD@

## 2011-05-07 NOTE — Interval H&P Note (Signed)
History and Physical Interval Note:  05/07/2011 12:45 PM  Lindsey Chan  has presented today for surgery, with the diagnosis of ENDOMETRIAL POLYP, MENORRHAGIA  The various methods of treatment have been discussed with the patient and family. After consideration of risks, benefits and other options for treatment, the patient has consented to  Procedure(s) (LRB): DILATATION & CURETTAGE/HYSTEROSCOPY WITH RESECTOCOPE (N/A) as a surgical intervention . Resectoscopic polypectomy and endometrial ablation. The patients' history has been reviewed, patient examined, no change in status, stable for surgery.  I have reviewed the patients' chart and labs.  Questions were answered to the patient's satisfaction.     Ok Edwards

## 2011-05-13 ENCOUNTER — Telehealth: Payer: Self-pay | Admitting: *Deleted

## 2011-05-13 NOTE — Telephone Encounter (Signed)
Pt is had D&C pt c/o spotting bleeding, pt informed this okay to watch if bleeding should increase to call.

## 2011-05-21 ENCOUNTER — Ambulatory Visit (INDEPENDENT_AMBULATORY_CARE_PROVIDER_SITE_OTHER): Payer: BC Managed Care – PPO | Admitting: Gynecology

## 2011-05-21 ENCOUNTER — Encounter: Payer: Self-pay | Admitting: Gynecology

## 2011-05-21 VITALS — BP 122/70

## 2011-05-21 DIAGNOSIS — Z9889 Other specified postprocedural states: Secondary | ICD-10-CM

## 2011-05-21 DIAGNOSIS — N898 Other specified noninflammatory disorders of vagina: Secondary | ICD-10-CM

## 2011-05-21 LAB — WET PREP FOR TRICH, YEAST, CLUE: Trich, Wet Prep: NONE SEEN

## 2011-05-21 MED ORDER — METRONIDAZOLE 0.75 % VA GEL: VAGINAL | Status: DC

## 2011-05-21 NOTE — Progress Notes (Signed)
Patient presented to the office for postop visit she is status post diagnostic hysteroscopy with resectoscopic polypectomy with the true clear morcellator along with endometrial ablation with the rollerball bar (vaporization ACMI electrode) secondary to dysfunctional uterine bleeding and endometrial polyps. Patient is doing well with the exception of a small amount of drainage.  Final pathology report as follows: Diagnosis Endometrial polyp - BENIGN ENDOMETRIAL POLYP - SECRETORY PHASE ENDOMETRIUM - NEGATIVE FOR HYPERPLASIA OR MALIGNANCY - FRAGMENTS OF BENIGN SMOOTH MUSCLE  Exam: Abdomen: Soft nontender no rebound or guarding Pelvic: Bartholin urethra Skene was within normal limits Vagina: Slight watery discharge was noted with slight fishy odor Cervix: No gross lesions but she Uterus: Anteverted normal size shape and consistency Adnexa no palpable masses or tenderness Rectal exam: Not done  Wet prep demonstrated a few WBCs many bacteria  Assessment/plan: Patient doing well from her resectoscopic polypectomy and endometrial ablation. For her mild BV she'll be given a prescription MetroGel to apply intravaginally each bedtime for 5 nights. She may resume normal activity.

## 2011-05-21 NOTE — Patient Instructions (Signed)
Bacterial Vaginosis Bacterial vaginosis (BV) is a vaginal infection where the normal balance of bacteria in the vagina is disrupted. The normal balance is then replaced by an overgrowth of certain bacteria. There are several different kinds of bacteria that can cause BV. BV is the most common vaginal infection in women of childbearing age. CAUSES   The cause of BV is not fully understood. BV develops when there is an increase or imbalance of harmful bacteria.   Some activities or behaviors can upset the normal balance of bacteria in the vagina and put women at increased risk including:   Having a new sex partner or multiple sex partners.   Douching.   Using an intrauterine device (IUD) for contraception.   It is not clear what role sexual activity plays in the development of BV. However, women that have never had sexual intercourse are rarely infected with BV.  Women do not get BV from toilet seats, bedding, swimming pools or from touching objects around them.  SYMPTOMS   Grey vaginal discharge.   A fish-like odor with discharge, especially after sexual intercourse.   Itching or burning of the vagina and vulva.   Burning or pain with urination.   Some women have no signs or symptoms at all.  DIAGNOSIS  Your caregiver must examine the vagina for signs of BV. Your caregiver will perform lab tests and look at the sample of vaginal fluid through a microscope. They will look for bacteria and abnormal cells (clue cells), a pH test higher than 4.5, and a positive amine test all associated with BV.  RISKS AND COMPLICATIONS   Pelvic inflammatory disease (PID).   Infections following gynecology surgery.   Developing HIV.   Developing herpes virus.  TREATMENT  Sometimes BV will clear up without treatment. However, all women with symptoms of BV should be treated to avoid complications, especially if gynecology surgery is planned. Female partners generally do not need to be treated. However,  BV may spread between female sex partners so treatment is helpful in preventing a recurrence of BV.   BV may be treated with antibiotics. The antibiotics come in either pill or vaginal cream forms. Either can be used with nonpregnant or pregnant women, but the recommended dosages differ. These antibiotics are not harmful to the baby.   BV can recur after treatment. If this happens, a second round of antibiotics will often be prescribed.   Treatment is important for pregnant women. If not treated, BV can cause a premature delivery, especially for a pregnant woman who had a premature birth in the past. All pregnant women who have symptoms of BV should be checked and treated.   For chronic reoccurrence of BV, treatment with a type of prescribed gel vaginally twice a week is helpful.  HOME CARE INSTRUCTIONS   Finish all medication as directed by your caregiver.   Do not have sex until treatment is completed.   Tell your sexual partner that you have a vaginal infection. They should see their caregiver and be treated if they have problems, such as a mild rash or itching.   Practice safe sex. Use condoms. Only have 1 sex partner.  PREVENTION  Basic prevention steps can help reduce the risk of upsetting the natural balance of bacteria in the vagina and developing BV:  Do not have sexual intercourse (be abstinent).   Do not douche.   Use all of the medicine prescribed for treatment of BV, even if the signs and symptoms go away.     Tell your sex partner if you have BV. That way, they can be treated, if needed, to prevent reoccurrence.  SEEK MEDICAL CARE IF:   Your symptoms are not improving after 3 days of treatment.   You have increased discharge, pain, or fever.  MAKE SURE YOU:   Understand these instructions.   Will watch your condition.   Will get help right away if you are not doing well or get worse.  FOR MORE INFORMATION  Division of STD Prevention (DSTDP), Centers for Disease  Control and Prevention: www.cdc.gov/std American Social Health Association (ASHA): www.ashastd.org  Document Released: 12/22/2004 Document Revised: 12/11/2010 Document Reviewed: 06/14/2008 Exit[employer] Patient Information 2012 [employer]. 

## 2011-05-26 ENCOUNTER — Encounter: Payer: Self-pay | Admitting: Gynecology

## 2012-04-04 ENCOUNTER — Encounter: Payer: Self-pay | Admitting: Gynecology

## 2012-04-13 ENCOUNTER — Ambulatory Visit (INDEPENDENT_AMBULATORY_CARE_PROVIDER_SITE_OTHER): Payer: BC Managed Care – PPO | Admitting: Gynecology

## 2012-04-13 ENCOUNTER — Other Ambulatory Visit (HOSPITAL_COMMUNITY)
Admission: RE | Admit: 2012-04-13 | Discharge: 2012-04-13 | Disposition: A | Discharge status: Home / Self Care | Payer: BC Managed Care – PPO | Source: Ambulatory Visit | Attending: Gynecology | Admitting: Gynecology

## 2012-04-13 ENCOUNTER — Encounter: Payer: Self-pay | Admitting: Gynecology

## 2012-04-13 VITALS — BP 120/76 | Ht 65.25 in | Wt 177.0 lb

## 2012-04-13 DIAGNOSIS — Z01419 Encounter for gynecological examination (general) (routine) without abnormal findings: Secondary | ICD-10-CM | POA: Insufficient documentation

## 2012-04-13 DIAGNOSIS — Z1151 Encounter for screening for human papillomavirus (HPV): Secondary | ICD-10-CM | POA: Insufficient documentation

## 2012-04-13 LAB — CBC WITH DIFFERENTIAL/PLATELET
Eosinophils Absolute: 0.3 10*3/uL (ref 0.0–0.7)
Eosinophils Relative: 4 % (ref 0–5)
Hemoglobin: 12.8 g/dL (ref 12.0–15.0)
Lymphs Abs: 1.7 10*3/uL (ref 0.7–4.0)
MCH: 29.2 pg (ref 26.0–34.0)
MCHC: 33.1 g/dL (ref 30.0–36.0)
MCV: 88.4 fL (ref 78.0–100.0)
Monocytes Relative: 8 % (ref 3–12)
RBC: 4.38 MIL/uL (ref 3.87–5.11)

## 2012-04-13 NOTE — Progress Notes (Signed)
Lindsey Chan Dec 04, 1970 [id number]   History:    42 y.o.  for annual gyn exam with no complaints today. Patient reports regular light menstrual cycles. Her husband has had a vasectomy. Patient's last Pap smear was in 2012. Patient with past history laparoscopic left salpingo-oophorectomy in 2012 for removal of dermoid cyst. Also patient in May 2013 had resectoscopic polypectomy and endometrial ablation has done well. Her last mammogram 2013 was normal.  Past medical history,surgical history, family history and social history were all reviewed and documented in the EPIC chart.  Gynecologic History Patient's last menstrual period was 03/31/2012. Contraception: vasectomy Last Pap: 2012. Results were: normal Last mammogram: 2013. Results were: normal  Obstetric History OB History   Grav Para Term Preterm Abortions TAB SAB Ect Mult Living   2 2 2       2      # Outc Date GA Lbr Len/2nd Wgt Sex Del Anes PTL Lv   1 TRM     F SVD  No Yes   2 TRM     F SVD  No Yes       ROS: A ROS was performed and pertinent positives and negatives are included in the history.  GENERAL: No fevers or chills. HEENT: No change in vision, no earache, sore throat or sinus congestion. NECK: No pain or stiffness. CARDIOVASCULAR: No chest pain or pressure. No palpitations. PULMONARY: No shortness of breath, cough or wheeze. GASTROINTESTINAL: No abdominal pain, nausea, vomiting or diarrhea, melena or bright red blood per rectum. GENITOURINARY: No urinary frequency, urgency, hesitancy or dysuria. MUSCULOSKELETAL: No joint or muscle pain, no back pain, no recent trauma. DERMATOLOGIC: No rash, no itching, no lesions. ENDOCRINE: No polyuria, polydipsia, no heat or cold intolerance. No recent change in weight. HEMATOLOGICAL: No anemia or easy bruising or bleeding. NEUROLOGIC: No headache, seizures, numbness, tingling or weakness. PSYCHIATRIC: No depression, no loss of interest in normal activity or change in sleep pattern.      Exam: chaperone present  BP 120/76  Ht 5' 5.25" (1.657 m)  Wt 177 lb (80.287 kg)  BMI 29.24 kg/m2  LMP 03/31/2012  Body mass index is 29.24 kg/(m^2).  General appearance : Well developed well nourished female. No acute distress HEENT: Neck supple, trachea midline, no carotid bruits, no thyroidmegaly Lungs: Clear to auscultation, no rhonchi or wheezes, or rib retractions  Heart: Regular rate and rhythm, no murmurs or gallops Breast:Examined in sitting and supine position were symmetrical in appearance, no palpable masses or tenderness,  no skin retraction, no nipple inversion, no nipple discharge, no skin discoloration, no axillary or supraclavicular lymphadenopathy Abdomen: no palpable masses or tenderness, no rebound or guarding Extremities: no edema or skin discoloration or tenderness  Pelvic:  Bartholin, Urethra, Skene Glands: Within normal limits             Vagina: No gross lesions or discharge  Cervix: No gross lesions or discharge  Uterus  anteverted, normal size, shape and consistency, non-tender and mobile  Adnexa  Without masses or tenderness  Anus and perineum  normal   Rectovaginal  normal sphincter tone without palpated masses or tenderness             Hemoccult not done     Assessment/Plan:  42 y.o. female for annual exam with no abnormalities noted. We discussed the new Pap smear screening guidelines but patient states that she wants to have a Pap smear every year even if she has to pay for it. The following labs  were today CBC, hemoglobin A1c, TSH, screening cholesterol, and urinalysis. She was reminded take calcium vitamin D along with regular weightbearing exercises 3 or 4 times a week for osteoporosis prevention. We discussed importance of monthly self breast examination. And she will schedule her mammogram for this year.   Ok Edwards MD, 5:51 PM 04/13/2012

## 2012-04-13 NOTE — Patient Instructions (Signed)

## 2012-04-14 LAB — TSH: TSH: 2.356 u[IU]/mL (ref 0.350–4.500)

## 2012-04-14 LAB — URINALYSIS W MICROSCOPIC + REFLEX CULTURE
Casts: NONE SEEN
Crystals: NONE SEEN
Hgb urine dipstick: NEGATIVE
Leukocytes, UA: NEGATIVE
Nitrite: NEGATIVE
Specific Gravity, Urine: 1.008 (ref 1.005–1.030)
Squamous Epithelial / LPF: NONE SEEN
pH: 5.5 (ref 5.0–8.0)

## 2012-05-24 ENCOUNTER — Encounter: Payer: Self-pay | Admitting: Gynecology

## 2012-11-10 ENCOUNTER — Other Ambulatory Visit: Payer: Self-pay

## 2012-12-08 ENCOUNTER — Encounter: Payer: Self-pay | Admitting: Neurology

## 2012-12-08 ENCOUNTER — Ambulatory Visit (INDEPENDENT_AMBULATORY_CARE_PROVIDER_SITE_OTHER): Payer: BC Managed Care – PPO | Admitting: Neurology

## 2012-12-08 VITALS — BP 123/82 | HR 63 | Ht 65.0 in | Wt 175.0 lb

## 2012-12-08 DIAGNOSIS — R200 Anesthesia of skin: Secondary | ICD-10-CM

## 2012-12-08 DIAGNOSIS — R209 Unspecified disturbances of skin sensation: Secondary | ICD-10-CM

## 2012-12-08 DIAGNOSIS — R937 Abnormal findings on diagnostic imaging of other parts of musculoskeletal system: Secondary | ICD-10-CM

## 2012-12-08 NOTE — Progress Notes (Signed)
[facility]  PATIENT: Lindsey Chan DOB: 19-Jan-1970  HISTORICAL  Lindsey Chan is a 42 years old right-handed female, she is referred by orthopedic surgeon Lindsey Chan for evaluation of numbness in her feet, abnormal MRI scan.  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 Judson Chan November 10 2012, had MRI of the brain at [facility] orthopedic specialist, I only have the report, MRI of cervical spine without contrast, there were several foci of signal abnormality within the dorsal spinal cord, best seen at C3-4 C5-6 and T1 levels, these lesions are worrisome for demyelinating disease,  She is referred to our clinic for possibility of multiple sclerosis  REVIEW OF SYSTEMS: Full 14 system review of systems performed and notable only for blurry vision, ringing in ears, allergy, numbness  ALLERGIES: Allergies  Allergen Reactions  . Clarithromycin Shortness Of Breath  . Naproxen Shortness Of Breath  . Clindamycin/Lincomycin Hives  . Amoxicillin Rash    HOME MEDICATIONS: Outpatient Prescriptions Prior to Visit  Medication Sig Dispense Refill  . ALBUTEROL SULFATE IN Inhale 1-2 puffs into the lungs every 4 (four) hours as needed. For wheezing      . loratadine (CLARITIN) 10 MG tablet Take 10 mg by mouth daily as needed. For allergies      . Multiple Vitamins-Minerals (ADEKS) chewable tablet Chew 1 tablet by  mouth daily.       No facility-administered medications prior to visit.    PAST MEDICAL HISTORY: Past Medical History  Diagnosis Date  . Asthma     PAST SURGICAL HISTORY: Past Surgical History  Procedure Laterality Date  . Facial reconstruction surgery      BROKEN CHEEK  . Pelvic laparoscopy  01/20/10    LSO/ MATURE TERATOMA    FAMILY HISTORY: Family History  Problem Relation Age of Onset  . Hypertension Mother     SOCIAL HISTORY:  History   Social History  . Marital Status: Married    Spouse Name: N/A    Number of Children: 2  . Years of Education: college   Occupational History  .  [employer]   Social History Main Topics  . Smoking status: Never Smoker   . Smokeless tobacco: Never Used  . Alcohol Use: No  . Drug Use: No  . Sexual Activity: Yes     Comment: patient's husband with vasectomy   Other Topics Concern  . Not on file   Social History Narrative   Patient lives at home with her husband Lindsey Chan).   [place]           PHYSICAL EXAM   Filed Vitals:   12/08/12 1031  BP: 123/82  Pulse: 63  Height: 5\' 5"  (1.651 m)  Weight: 175 lb (79.379 kg)    Not recorded    Body mass index is 29.12 kg/(m^2).   Generalized: In no acute distress  Neck: Supple, no carotid bruits   Cardiac: Regular rate rhythm  Pulmonary: Clear to  auscultation bilaterally  Musculoskeletal: No deformity  Neurological examination  Mentation: Alert oriented to time, place, history taking, and causual conversation  Cranial nerve II-XII: Pupils were equal round reactive to light extraocular movements were full, visual field were full on confrontational test. facial sensation and strength were normal. hearing was intact to finger rubbing bilaterally. Uvula tongue midline.  head turning and shoulder shrug and were normal and symmetric.Tongue protrusion into cheek strength was normal.  Motor: normal tone, bulk and strength.  Sensory: Intact to fine  touch, pinprick, preserved vibratory sensation, and proprioception at toes.  Coordination: Normal finger to nose, heel-to-shin bilaterally there was no truncal ataxia  Gait: Rising up from seated position without assistance, normal stance, without trunk ataxia, moderate stride, good arm swing, smooth turning, able to perform tiptoe, and heel walking without difficulty.   Romberg signs: Negative  Deep tendon reflexes: Brachioradialis 2/2, biceps 2/2, triceps 2/2, patellar 2/2, Achilles 2/2, plantar responses were flexor bilaterally.   DIAGNOSTIC DATA (LABS, IMAGING, TESTING) - I reviewed patient records, labs, notes, testing and imaging myself where available.  Lab Results  Component Value Date   WBC 8.1 04/13/2012   HGB 12.8 04/13/2012   HCT 38.7 04/13/2012   MCV 88.4 04/13/2012   PLT 177 04/13/2012      Component Value Date/Time   GLUCOSE 90 03/31/2011 1502   Lab Results  Component Value Date   CHOL 120 04/13/2012   Lab Results  Component Value Date   HGBA1C 5.1 04/13/2012   No results found for this basename: VITAMINB12   Lab Results  Component Value Date   TSH 2.356 04/13/2012      ASSESSMENT AND PLAN   42 years old Caucasian female, with bilateral hands and feet paresthesia, Lhermitt signs,  abnormal MRI of cervical, which has demonstrated scattered foci of signal abnormality at the dorsal spinal cord at C3, C4, C5-6, and T1 levels,   1. most suggestive of possibility of relapsing remitting multiple sclerosis, other possibilities also include inflammatory, nutritional deficiency, infectious etiology 2 we will reinitiate evaluation with MRI of brain with and without contrast, 3 laboratory evaluations.  .     4 lumbar puncture.   5. VEP. 6. Return to clinic with MRI cervical spine.  Lindsey Chan, M.D. Ph.D.  [facility] [address] [place], [place] [address] [phone]

## 2012-12-12 LAB — IFE AND PE, SERUM
Albumin SerPl Elph-Mcnc: 4.1 g/dL (ref 3.2–5.6)
Albumin/Glob SerPl: 1.5 (ref 0.7–2.0)
Alpha 1: 0.2 g/dL (ref 0.1–0.4)
Alpha2 Glob SerPl Elph-Mcnc: 0.6 g/dL (ref 0.4–1.2)
B-Globulin SerPl Elph-Mcnc: 1 g/dL (ref 0.6–1.3)
Gamma Glob SerPl Elph-Mcnc: 1.1 g/dL (ref 0.5–1.6)
IgM (Immunoglobulin M), Srm: 200 mg/dL (ref 40–230)

## 2012-12-12 LAB — SEDIMENTATION RATE: Sed Rate: 2 mm/hr (ref 0–32)

## 2012-12-12 LAB — COMPREHENSIVE METABOLIC PANEL
Albumin: 4.5 g/dL (ref 3.5–5.5)
Alkaline Phosphatase: 53 IU/L (ref 39–117)
BUN/Creatinine Ratio: 18 (ref 9–23)
BUN: 15 mg/dL (ref 6–24)
CO2: 24 mmol/L (ref 18–29)
Creatinine, Ser: 0.85 mg/dL (ref 0.57–1.00)
Globulin, Total: 2.5 g/dL (ref 1.5–4.5)

## 2012-12-12 LAB — HIV ANTIBODY (ROUTINE TESTING W REFLEX): HIV-1/HIV-2 Ab: NONREACTIVE

## 2012-12-12 LAB — CBC WITH DIFFERENTIAL
Basos: 1 %
Eos: 3 %
Eosinophils Absolute: 0.2 10*3/uL (ref 0.0–0.4)
HCT: 40.2 % (ref 34.0–46.6)
Hemoglobin: 13.7 g/dL (ref 11.1–15.9)
Immature Grans (Abs): 0 10*3/uL (ref 0.0–0.1)
Lymphs: 30 %
MCHC: 34.1 g/dL (ref 31.5–35.7)
Monocytes: 9 %
Neutrophils Absolute: 3.8 10*3/uL (ref 1.4–7.0)
Neutrophils Relative %: 57 %
RBC: 4.46 x10E6/uL (ref 3.77–5.28)
RDW: 13.5 % (ref 12.3–15.4)

## 2012-12-12 LAB — HEPATITIS PANEL, ACUTE
Hep A IgM: NEGATIVE
Hep B C IgM: NEGATIVE
Hep C Virus Ab: <0.1 s/co ratio (ref 0.0–0.9)
Hepatitis B Surface Ag: NEGATIVE

## 2012-12-12 LAB — C-REACTIVE PROTEIN: CRP: 2 mg/L (ref 0.0–4.9)

## 2012-12-12 LAB — VITAMIN B12: Vitamin B-12: 485 pg/mL (ref 211–946)

## 2012-12-12 LAB — LYME, TOTAL AB TEST/REFLEX: Lyme IgG/IgM Ab: <0.91 {ISR} (ref 0.00–0.90)

## 2012-12-12 LAB — COPPER, SERUM: Copper: 110 ug/dL (ref 72–166)

## 2012-12-12 LAB — CK: Total CK: 46 U/L (ref 24–173)

## 2012-12-12 LAB — FOLATE: Folate: 11.9 ng/mL (ref >3.0)

## 2012-12-12 LAB — THYROID PANEL WITH TSH
Free Thyroxine Index: 2.4 (ref 1.2–4.9)
T4, Total: 8.2 ug/dL (ref 4.5–12.0)
TSH: 3.57 u[IU]/mL (ref 0.450–4.500)

## 2012-12-15 ENCOUNTER — Ambulatory Visit (INDEPENDENT_AMBULATORY_CARE_PROVIDER_SITE_OTHER): Payer: BC Managed Care – PPO

## 2012-12-15 DIAGNOSIS — R200 Anesthesia of skin: Secondary | ICD-10-CM

## 2012-12-15 DIAGNOSIS — R937 Abnormal findings on diagnostic imaging of other parts of musculoskeletal system: Secondary | ICD-10-CM

## 2012-12-15 NOTE — Procedures (Signed)
    History:   Lindsey Chan is a 42 year old patient with a history of an abnormal MRI the brain. The patient is being evaluated for possible demyelinating disease.  Description: The visual evoked response test was performed today using 32 x 32 check sizes. The absolute latencies for the N1 and the P100 wave forms were within normal limits bilaterally. The amplitudes for the P100 wave forms were also within normal limits bilaterally. The visual acuity was 20/20 OD and 20/20 OS uncorrected.  Impression:  The visual evoked response test above was within normal limits bilaterally. No evidence of conduction slowing was seen within the anterior visual pathways on either side on today's evaluation.

## 2012-12-18 ENCOUNTER — Ambulatory Visit
Admission: RE | Admit: 2012-12-18 | Discharge: 2012-12-18 | Disposition: A | Discharge status: Home / Self Care | Payer: BC Managed Care – PPO | Source: Ambulatory Visit | Attending: Neurology | Admitting: Neurology

## 2012-12-18 ENCOUNTER — Other Ambulatory Visit: Payer: BC Managed Care – PPO

## 2012-12-18 DIAGNOSIS — R937 Abnormal findings on diagnostic imaging of other parts of musculoskeletal system: Secondary | ICD-10-CM

## 2012-12-18 DIAGNOSIS — R200 Anesthesia of skin: Secondary | ICD-10-CM

## 2012-12-18 DIAGNOSIS — R9409 Abnormal results of other function studies of central nervous system: Secondary | ICD-10-CM

## 2012-12-18 MED ORDER — GADOBENATE DIMEGLUMINE 529 MG/ML IV SOLN
15.0000 mL | Freq: Once | INTRAVENOUS | Status: AC | PRN
  Administered 2012-12-18: 15 mL via INTRAVENOUS

## 2012-12-19 ENCOUNTER — Telehealth: Payer: Self-pay | Admitting: Neurology

## 2012-12-19 ENCOUNTER — Ambulatory Visit
Admission: RE | Admit: 2012-12-19 | Discharge: 2012-12-19 | Disposition: A | Discharge status: Home / Self Care | Payer: BC Managed Care – PPO | Source: Ambulatory Visit | Attending: Neurology | Admitting: Neurology

## 2012-12-19 DIAGNOSIS — R200 Anesthesia of skin: Secondary | ICD-10-CM

## 2012-12-19 DIAGNOSIS — R937 Abnormal findings on diagnostic imaging of other parts of musculoskeletal system: Secondary | ICD-10-CM

## 2012-12-19 LAB — GRAM STAIN: Gram Stain: NONE SEEN

## 2012-12-19 LAB — CSF PANEL 1
Glucose, CSF: 57 mg/dL (ref 43–76)
RBC Count, CSF: 0 cu mm
Tube #: 3
WBC, CSF: 0 cu mm (ref 0–5)

## 2012-12-19 NOTE — Progress Notes (Signed)
Blood drawn to go with spinal fluid. Blood drawn from left [facility], site is unremarkable and pt tolerated procedure well.

## 2012-12-19 NOTE — Telephone Encounter (Signed)
Please advise 

## 2012-12-21 ENCOUNTER — Telehealth: Payer: Self-pay | Admitting: Radiology

## 2012-12-21 NOTE — Telephone Encounter (Signed)
Pt's husband called today. Pt was fine yesterday, after having an LP on Monday. She is at work and now has a headache and stiff neck. Husband instructed to have pt lie down for the next 24 hours and let me know in the morning if she is not better.

## 2012-12-24 LAB — MULTIPLE SCLEROSIS PANEL 2
Albumin CSF: 17 mg/dL (ref <35)
IgA MSPROF: <0.001 mg/dL (ref <0.010)
IgG Total CSF: 1.9 mg/dL (ref 0.5–6.1)
IgG Total: 910 mg/dL (ref 694–1618)
IgG: <0.01 mg/dL (ref <0.10)
IgM-CSF: 0.04 mg/dL (ref <0.10)
Myelin basic protein, csf: <2 mcg/L (ref <4.1)

## 2013-01-11 ENCOUNTER — Ambulatory Visit (INDEPENDENT_AMBULATORY_CARE_PROVIDER_SITE_OTHER): Payer: BC Managed Care – PPO | Admitting: Neurology

## 2013-01-11 ENCOUNTER — Encounter: Payer: Self-pay | Admitting: Neurology

## 2013-01-11 VITALS — BP 120/82 | HR 60 | Ht 65.0 in | Wt 175.0 lb

## 2013-01-11 DIAGNOSIS — R937 Abnormal findings on diagnostic imaging of other parts of musculoskeletal system: Secondary | ICD-10-CM

## 2013-01-11 DIAGNOSIS — R209 Unspecified disturbances of skin sensation: Secondary | ICD-10-CM

## 2013-01-11 DIAGNOSIS — R200 Anesthesia of skin: Secondary | ICD-10-CM

## 2013-01-11 NOTE — Progress Notes (Signed)
[facility]  PATIENT: Lindsey Chan DOB: [birth year]  HISTORICAL  Fatimata is a 43 years old right-handed female, she is referred by orthopedic surgeon Clista Bernhardt for evaluation of numbness in her feet, abnormal MRI scan.  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 Judson Roch November 10 2012, had MRI of the brain at [facility] orthopedic specialist, I only have the report, MRI of cervical spine without contrast, there were several foci of signal abnormality within the dorsal spinal cord, best seen at C3-4 C5-6 and T1 levels, these lesions are worrisome for demyelinating disease,  She is referred to our clinic for possibility of multiple sclerosis  UPDATE Jan 7th 2015: She has intermittent bilateral feet paresthesia, much improved, feet feeling cold, mild Lhermitte signs.     We had extensive evaluation since her initial visit, including normal of negative laboratory evaluation, HIV, RPR, ANA, protein electrophoresis, Lyme titer, CBC, CMP, B12, copper level,  CSF study was normal as well,  MRI of the brain with and without contrast was normal, there was mild sinusitis,  We have reviewed MRI cervical together, there was patchy focal area of mild T2 flair abnormality at dorsums spinal cord, best at C3-4, C5-6, T1 levels, no contrast was given,  Normal visual evoked potential,  REVIEW OF SYSTEMS: Full 14 system  review of systems performed and notable only for blurry vision, ringing in ears, allergy, numbness  ALLERGIES: Allergies  Allergen Reactions  . Clarithromycin Shortness Of Breath  . Naproxen Shortness Of Breath  . Clindamycin/Lincomycin Hives  . Amoxicillin Rash    HOME MEDICATIONS: Outpatient Prescriptions Prior to Visit  Medication Sig Dispense Refill  . ALBUTEROL SULFATE IN Inhale 1-2 puffs into the lungs every 4 (four) hours as needed. For wheezing      . loratadine (CLARITIN) 10 MG tablet Take 10 mg by mouth daily as needed. For allergies      . Multiple Vitamins-Minerals (ADEKS) chewable tablet Chew 1 tablet by mouth daily.       No facility-administered medications prior to visit.    PAST MEDICAL HISTORY: Past Medical History  Diagnosis Date  . Asthma     PAST SURGICAL HISTORY: Past Surgical History  Procedure Laterality Date  . Facial reconstruction surgery      BROKEN CHEEK  . Pelvic laparoscopy  01/20/10    LSO/ MATURE TERATOMA    FAMILY HISTORY: Family History  Problem Relation Age of Onset  . Hypertension Mother     SOCIAL HISTORY:  History   Social History  . Marital Status: Married    Spouse Name: Brain    Number of Children: 2  . Years of Education: college   Occupational History  .  [employer]   Social History Main Topics  . Smoking status: Never Smoker   . Smokeless tobacco: Never Used  . Alcohol Use: No  . Drug Use: No  . Sexual Activity: Yes     Comment: patient's husband with vasectomy  Other Topics Concern  . Not on file   Social History Narrative   [name](Brian).   [place]- Works Full time   Right handed.   Caffeine- None   Education- College              PHYSICAL EXAM   Filed Vitals:   01/11/13 1021  BP: 120/82  Pulse: 60  Height: 5\' 5"  (1.651 m)  Weight: 175 lb (79.379 kg)    Not recorded    Body mass index is 29.12 kg/(m^2).   Generalized: In no acute  distress  Neck: Supple, no carotid bruits   Cardiac: Regular rate rhythm  Pulmonary: Clear to auscultation bilaterally  Musculoskeletal: No deformity  Neurological examination  Mentation: Alert oriented to time, place, history taking, and causual conversation  Cranial nerve II-XII: Pupils were equal round reactive to light extraocular movements were full, visual field were full on confrontational test. facial sensation and strength were normal. hearing was intact to finger rubbing bilaterally. Uvula tongue midline.  head turning and shoulder shrug and were normal and symmetric.Tongue protrusion into cheek strength was normal.  Motor: normal tone, bulk and strength.  Sensory: Intact to fine touch, pinprick, preserved vibratory sensation, and proprioception at toes.  Coordination: Normal finger to nose, heel-to-shin bilaterally there was no truncal ataxia  Gait: Rising up from seated position without assistance, normal stance, without trunk ataxia, moderate stride, good arm swing, smooth turning, able to perform tiptoe, and heel walking without difficulty.   Romberg signs: Negative  Deep tendon reflexes: Brachioradialis 2/2, biceps 2/2, triceps 2/2, patellar 2/2, Achilles 2/2, plantar responses were flexor bilaterally.   DIAGNOSTIC DATA (LABS, IMAGING, TESTING) - I reviewed patient records, labs, notes, testing and imaging myself where available.  Lab Results  Component Value Date   WBC 6.7 12/08/2012   HGB 13.7 12/08/2012   HCT 40.2 12/08/2012   MCV 90 12/08/2012   PLT 205 12/08/2012      Component Value Date/Time   NA 140 12/08/2012 1132   K 3.8 12/08/2012 1132   CL 100 12/08/2012 1132   CO2 24 12/08/2012 1132   GLUCOSE 83 12/08/2012 1132   GLUCOSE 90 03/31/2011 1502   BUN 15 12/08/2012 1132   CREATININE 0.85 12/08/2012 1132   CALCIUM 9.6 12/08/2012 1132   PROT 7.0 12/08/2012 1132   AST 13 12/08/2012 1132   ALT 12 12/08/2012 1132   ALKPHOS 53 12/08/2012 1132   BILITOT 0.5  12/08/2012 1132   GFRNONAA 84 12/08/2012 1132   GFRAA 97 12/08/2012 1132   Lab Results  Component Value Date   CHOL 120 04/13/2012   Lab Results  Component Value Date   HGBA1C 5.1 04/13/2012   Lab Results  Component Value Date   VITAMINB12 485 12/08/2012   Lab Results  Component Value Date   TSH 3.570 12/08/2012      ASSESSMENT AND PLAN   43 years old Caucasian female, with bilateral hands and feet paresthesia, Lhermitt signs,  abnormal MRI of cervical, which has demonstrated scattered foci of signal abnormality at the dorsal spinal cord at C3, C4, C5-6, and T1 levels,   She has normal neurological examination, normal laboratory, MRI of the brain, visual evoked potential, CSF, 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 There is not enough evidence to support a diagnosis of multiple sclerosis at this point, I have suggested her to continue monitoring her symptoms, return to clinic as needed    Levert FeinsteinYijun Tashaya Ancrum, M.D. Ph.D.  [facility] [address], [address] [place], [place] [address] [phone] 

## 2013-01-16 LAB — FUNGUS CULTURE W SMEAR: Smear Result: NONE SEEN

## 2013-08-23 ENCOUNTER — Encounter: Payer: Self-pay | Admitting: Gynecology

## 2013-09-15 ENCOUNTER — Ambulatory Visit: Payer: BC Managed Care – PPO | Admitting: Podiatrist

## 2013-09-20 ENCOUNTER — Encounter: Payer: Self-pay | Admitting: Gynecology

## 2013-09-20 ENCOUNTER — Ambulatory Visit (INDEPENDENT_AMBULATORY_CARE_PROVIDER_SITE_OTHER): Payer: BC Managed Care – PPO | Admitting: Gynecology

## 2013-09-20 VITALS — BP 130/78 | Ht 65.25 in | Wt 182.2 lb

## 2013-09-20 DIAGNOSIS — R635 Abnormal weight gain: Secondary | ICD-10-CM

## 2013-09-20 DIAGNOSIS — Z01419 Encounter for gynecological examination (general) (routine) without abnormal findings: Secondary | ICD-10-CM

## 2013-09-20 LAB — CBC WITH DIFFERENTIAL/PLATELET
BASOS ABS: 0.1 10*3/uL (ref 0.0–0.1)
BASOS PCT: 1 % (ref 0–1)
Eosinophils Absolute: 0.5 10*3/uL (ref 0.0–0.7)
Eosinophils Relative: 6 % — ABNORMAL HIGH (ref 0–5)
HCT: 37.3 % (ref 36.0–46.0)
Hemoglobin: 12.7 g/dL (ref 12.0–15.0)
LYMPHS PCT: 27 % (ref 12–46)
Lymphs Abs: 2.2 10*3/uL (ref 0.7–4.0)
MCH: 29.9 pg (ref 26.0–34.0)
MCHC: 34 g/dL (ref 30.0–36.0)
MCV: 87.8 fL (ref 78.0–100.0)
MONO ABS: 0.8 10*3/uL (ref 0.1–1.0)
Monocytes Relative: 10 % (ref 3–12)
NEUTROS ABS: 4.6 10*3/uL (ref 1.7–7.7)
Neutrophils Relative %: 56 % (ref 43–77)
PLATELETS: 210 10*3/uL (ref 150–400)
RBC: 4.25 MIL/uL (ref 3.87–5.11)
RDW: 13.7 % (ref 11.5–15.5)
WBC: 8.2 10*3/uL (ref 4.0–10.5)

## 2013-09-20 LAB — COMPREHENSIVE METABOLIC PANEL
ALK PHOS: 49 U/L (ref 39–117)
ALT: 10 U/L (ref 0–35)
AST: 14 U/L (ref 0–37)
Albumin: 4.4 g/dL (ref 3.5–5.2)
BUN: 17 mg/dL (ref 6–23)
CALCIUM: 9.4 mg/dL (ref 8.4–10.5)
CHLORIDE: 104 meq/L (ref 96–112)
CO2: 29 mEq/L (ref 19–32)
Creat: 0.84 mg/dL (ref 0.50–1.10)
Glucose, Bld: 60 mg/dL — ABNORMAL LOW (ref 70–99)
Potassium: 3.6 mEq/L (ref 3.5–5.3)
Sodium: 139 mEq/L (ref 135–145)
Total Bilirubin: 0.6 mg/dL (ref 0.2–1.2)
Total Protein: 6.7 g/dL (ref 6.0–8.3)

## 2013-09-20 LAB — CHOLESTEROL, TOTAL: Cholesterol: 124 mg/dL (ref 0–200)

## 2013-09-20 NOTE — Progress Notes (Signed)
Natalie Mceuen 11/08/1970 [id number]   History:    43 y.o.  for annual gyn exam with no complaints today. Patient was waiting 177 pounds last year and is up to 182. Patient declined flu vaccine today. Patient with no previous history of abnormal Pap smear.Her husband has had a vasectomy. Patient's last Pap smear was in 2012. Patient with past history laparoscopic left salpingo-oophorectomy in 2012 for removal of dermoid cyst. Also patient in May 2013 had resectoscopic polypectomy and endometrial ablation has done well. Her menstrual cycles her lites and last approximately 3 days.   Past medical history,surgical history, family history and social history were all reviewed and documented in the EPIC chart.  Gynecologic History Patient's last menstrual period was 09/06/2013. Contraception: vasectomy Last Pap: 2014. Results were: normal Last mammogram: 2015. Results were: normal  Obstetric History OB History  Gravida Para Term Preterm AB SAB TAB Ectopic Multiple Living  # Outcome Date GA Lbr Len/2nd Weight Sex Delivery Anes PTL Lv  2 TRM     F SVD  N Y  1 TRM     F SVD  N Y       ROS: A ROS was performed and pertinent positives and negatives are included in the history.  GENERAL: No fevers or chills. HEENT: No change in vision, no earache, sore throat or sinus congestion. NECK: No pain or stiffness. CARDIOVASCULAR: No chest pain or pressure. No palpitations. PULMONARY: No shortness of breath, cough or wheeze. GASTROINTESTINAL: No abdominal pain, nausea, vomiting or diarrhea, melena or bright red blood per rectum. GENITOURINARY: No urinary frequency, urgency, hesitancy or dysuria. MUSCULOSKELETAL: No joint or muscle pain, no back pain, no recent trauma. DERMATOLOGIC: No rash, no itching, no lesions. ENDOCRINE: No polyuria, polydipsia, no heat or cold intolerance. No recent change in weight. HEMATOLOGICAL: No anemia or easy bruising or bleeding. NEUROLOGIC: No headache,  seizures, numbness, tingling or weakness. PSYCHIATRIC: No depression, no loss of interest in normal activity or change in sleep pattern.     Exam: chaperone present  BP 130/78  Ht 5' 5.25" (1.657 m)  Wt 182 lb 3.2 oz (82.645 kg)  BMI 30.10 kg/m2  LMP 09/06/2013  Body mass index is 30.1 kg/(m^2).  General appearance : Well developed well nourished female. No acute distress HEENT: Neck supple, trachea midline, no carotid bruits, no thyroidmegaly Lungs: Clear to auscultation, no rhonchi or wheezes, or rib retractions  Heart: Regular rate and rhythm, no murmurs or gallops Breast:Examined in sitting and supine position were symmetrical in appearance, no palpable masses or tenderness,  no skin retraction, no nipple inversion, no nipple discharge, no skin discoloration, no axillary or supraclavicular lymphadenopathy Abdomen: no palpable masses or tenderness, no rebound or guarding Extremities: no edema or skin discoloration or tenderness  Pelvic:  Bartholin, Urethra, Skene Glands: Within normal limits             Vagina: No gross lesions or discharge  Cervix: Absent  Uterus  absent   Adnexa  Without masses or tenderness  Anus and perineum  normal   Rectovaginal  normal sphincter tone without palpated masses or tenderness             Hemoccult not indicated     Assessment/Plan:  43 y.o. female for annual exam who declined flu vaccine today. Pap smear was not done today in accordance to the new guidelines. The following labs were ordered: CBC, screening  cholesterol, TSH, comprehensive metabolic panel, and urinalysis. We discussed importance of calcium and vitamin D in regular exercise for osteoporosis prevention. We discussed importance of monthly self breast examination.  Note: This dictation was prepared with  Dragon/digital dictation along withSmart phrase technology. Any transcriptional errors that result from this process are unintentional.   Ok Edwards MD, 4:33 PM  09/20/2013

## 2013-09-20 NOTE — Patient Instructions (Signed)

## 2013-09-21 LAB — URINALYSIS W MICROSCOPIC + REFLEX CULTURE
BILIRUBIN URINE: NEGATIVE
CRYSTALS: NONE SEEN
Casts: NONE SEEN
GLUCOSE, UA: NEGATIVE mg/dL
Hgb urine dipstick: NEGATIVE
Ketones, ur: NEGATIVE mg/dL
Leukocytes, UA: NEGATIVE
Nitrite: NEGATIVE
PH: 8 (ref 5.0–8.0)
Protein, ur: NEGATIVE mg/dL
Specific Gravity, Urine: 1.017 (ref 1.005–1.030)
Urobilinogen, UA: 1 mg/dL (ref 0.0–1.0)

## 2013-09-21 LAB — TSH: TSH: 2.122 u[IU]/mL (ref 0.350–4.500)

## 2013-10-20 ENCOUNTER — Other Ambulatory Visit: Payer: Self-pay

## 2013-11-06 ENCOUNTER — Encounter: Payer: Self-pay | Admitting: Gynecology

## 2014-10-15 ENCOUNTER — Encounter: Payer: Self-pay | Admitting: Gynecology

## 2014-11-14 ENCOUNTER — Encounter: Payer: Self-pay | Admitting: Gynecology

## 2014-11-21 ENCOUNTER — Encounter: Payer: Self-pay | Admitting: Gynecology

## 2014-11-21 ENCOUNTER — Ambulatory Visit (INDEPENDENT_AMBULATORY_CARE_PROVIDER_SITE_OTHER): Payer: BLUE CROSS/BLUE SHIELD | Admitting: Gynecology

## 2014-11-21 VITALS — BP 116/70 | Ht 65.5 in | Wt 183.0 lb

## 2014-11-21 DIAGNOSIS — N920 Excessive and frequent menstruation with regular cycle: Secondary | ICD-10-CM

## 2014-11-21 DIAGNOSIS — N946 Dysmenorrhea, unspecified: Secondary | ICD-10-CM | POA: Diagnosis not present

## 2014-11-21 DIAGNOSIS — Z01419 Encounter for gynecological examination (general) (routine) without abnormal findings: Secondary | ICD-10-CM | POA: Diagnosis not present

## 2014-11-21 LAB — CBC WITH DIFFERENTIAL/PLATELET
Basophils Absolute: 0 10*3/uL (ref 0.0–0.1)
Basophils Relative: 0 % (ref 0–1)
EOS PCT: 5 % (ref 0–5)
Eosinophils Absolute: 0.5 10*3/uL (ref 0.0–0.7)
HCT: 37.3 % (ref 36.0–46.0)
Hemoglobin: 12.6 g/dL (ref 12.0–15.0)
Lymphocytes Relative: 28 % (ref 12–46)
Lymphs Abs: 2.7 10*3/uL (ref 0.7–4.0)
MCH: 29.9 pg (ref 26.0–34.0)
MCHC: 33.8 g/dL (ref 30.0–36.0)
MCV: 88.4 fL (ref 78.0–100.0)
MPV: 11.7 fL (ref 8.6–12.4)
Monocytes Absolute: 0.7 10*3/uL (ref 0.1–1.0)
Monocytes Relative: 7 % (ref 3–12)
Neutro Abs: 5.7 10*3/uL (ref 1.7–7.7)
Neutrophils Relative %: 60 % (ref 43–77)
PLATELETS: 218 10*3/uL (ref 150–400)
RBC: 4.22 MIL/uL (ref 3.87–5.11)
RDW: 13.6 % (ref 11.5–15.5)
WBC: 9.5 10*3/uL (ref 4.0–10.5)

## 2014-11-21 LAB — COMPREHENSIVE METABOLIC PANEL
ALK PHOS: 48 U/L (ref 33–115)
ALT: 14 U/L (ref 6–29)
AST: 16 U/L (ref 10–35)
Albumin: 4.3 g/dL (ref 3.6–5.1)
BUN: 29 mg/dL — AB (ref 7–25)
CO2: 25 mmol/L (ref 20–31)
Calcium: 9.5 mg/dL (ref 8.6–10.2)
Chloride: 102 mmol/L (ref 98–110)
Creat: 0.79 mg/dL (ref 0.50–1.10)
GLUCOSE: 84 mg/dL (ref 65–99)
Potassium: 3.6 mmol/L (ref 3.5–5.3)
Sodium: 137 mmol/L (ref 135–146)
TOTAL PROTEIN: 6.8 g/dL (ref 6.1–8.1)
Total Bilirubin: 0.7 mg/dL (ref 0.2–1.2)

## 2014-11-21 LAB — LIPID PANEL
CHOLESTEROL: 138 mg/dL (ref 125–200)
HDL: 33 mg/dL — ABNORMAL LOW (ref >=46)
LDL Cholesterol: 77 mg/dL (ref <130)
Total CHOL/HDL Ratio: 4.2 Ratio (ref <=5.0)
Triglycerides: 141 mg/dL (ref <150)
VLDL: 28 mg/dL (ref <30)

## 2014-11-21 LAB — TSH: TSH: 2.063 u[IU]/mL (ref 0.350–4.500)

## 2014-11-21 NOTE — Progress Notes (Signed)
Lindsey Chan 05-06-1970 [id number]   History:    44 y.o.  for annual gyn exam complaining of dysmenorrhea and menorrhagia lasting up to 7 days. Also she has abdominal cramping especially left lower quadrant on and off at times. Review of her record indicated that patient has a history of laparoscopic left salpingo-oophorectomy in 2012 for removal of dermoid cyst. Also patient in May 2013 had resectoscopic polypectomy and endometrial ablation has done well. No prior history of abnormal Pap smears.  Past medical history,surgical history, family history and social history were all reviewed and documented in the EPIC chart.  Gynecologic History Patient's last menstrual period was 11/15/2014. Contraception: vasectomy Last Pap: 2014. Results were: normal Last mammogram: 2016. Results were: normal  Obstetric History OB History  Gravida Para Term Preterm AB SAB TAB Ectopic Multiple Living  2 2 2       2     # Outcome Date GA Lbr Len/2nd Weight Sex Delivery Anes PTL Lv  2 Term     F Vag-Spont  N Y  1 Term     F Vag-Spont  N Y       ROS: A ROS was performed and pertinent positives and negatives are included in the history.  GENERAL: No fevers or chills. HEENT: No change in vision, no earache, sore throat or sinus congestion. NECK: No pain or stiffness. CARDIOVASCULAR: No chest pain or pressure. No palpitations. PULMONARY: No shortness of breath, cough or wheeze. GASTROINTESTINAL: No abdominal pain, nausea, vomiting or diarrhea, melena or bright red blood per rectum. GENITOURINARY: No urinary frequency, urgency, hesitancy or dysuria. MUSCULOSKELETAL: No joint or muscle pain, no back pain, no recent trauma. DERMATOLOGIC: No rash, no itching, no lesions. ENDOCRINE: No polyuria, polydipsia, no heat or cold intolerance. No recent change in weight. HEMATOLOGICAL: No anemia or easy bruising or bleeding. NEUROLOGIC: No headache, seizures, numbness, tingling or weakness. PSYCHIATRIC: No depression, no  loss of interest in normal activity or change in sleep pattern.     Exam: chaperone present  BP 116/70 mmHg  Ht 5' 5.5" (1.664 m)  Wt 183 lb (83.008 kg)  BMI 29.98 kg/m2  LMP 11/15/2014  Body mass index is 29.98 kg/(m^2).  General appearance : Well developed well nourished female. No acute distress HEENT: Eyes: no retinal hemorrhage or exudates,  Neck supple, trachea midline, no carotid bruits, no thyroidmegaly Lungs: Clear to auscultation, no rhonchi or wheezes, or rib retractions  Heart: Regular rate and rhythm, no murmurs or gallops Breast:Examined in sitting and supine position were symmetrical in appearance, no palpable masses or tenderness,  no skin retraction, no nipple inversion, no nipple discharge, no skin discoloration, no axillary or supraclavicular lymphadenopathy Abdomen: no palpable masses or tenderness, no rebound or guarding Extremities: no edema or skin discoloration or tenderness  Pelvic:  Bartholin, Urethra, Skene Glands: Within normal limits             Vagina: No gross lesions or discharge  Cervix: No gross lesions or discharge  Uterus  anteverted, normal size, shape and consistency, non-tender and mobile  Adnexa  Without masses or tenderness  Anus and perineum  normal   Rectovaginal  normal sphincter tone without palpated masses or tenderness             Hemoccult not indicated     Assessment/Plan:  44 y.o. female for annual exam with worsening dysmenorrhea and menorrhagia. Prior history resectoscopic polypectomy and endometrial ablation. We discussed possible post-ablation syndrome. We discussed scheduling next week  a sonohysterogram in effort cavity will allow we would consider placing a Mirena IUD to help with her menorrhagia. Other options discussed would be the Lysteda 650 mg 2 tablets 3 times a day during her menses. We also discussed definitive surgery such as a laparoscopic assisted vaginal hysterectomy with bilateral salpingectomy. Patient was  provided with literature information all the above subject and we'll discuss further after the sonohysterogram next week. The following screening blood work was ordered today: Comprehensive metabolic panel, fasting lipid profile, TSH, CBC, and urinalysis.   Ok Edwards MD, 4:05 PM 11/21/2014

## 2014-11-21 NOTE — Patient Instructions (Signed)
Levonorgestrel intrauterine device (IUD) What is this medicine? LEVONORGESTREL IUD (LEE voe nor jes trel) is a contraceptive (birth control) device. The device is placed inside the uterus by a healthcare professional. It is used to prevent pregnancy and can also be used to treat heavy bleeding that occurs during your period. Depending on the device, it can be used for 3 to 5 years. This medicine may be used for other purposes; ask your health care provider or pharmacist if you have questions. What should I tell my health care provider before I take this medicine? They need to know if you have any of these conditions: -abnormal Pap smear -cancer of the breast, uterus, or cervix -diabetes -endometritis -genital or pelvic infection now or in the past -have more than one sexual partner or your partner has more than one partner -heart disease -history of an ectopic or tubal pregnancy -immune system problems -IUD in place -liver disease or tumor -problems with blood clots or take blood-thinners -use intravenous drugs -uterus of unusual shape -vaginal bleeding that has not been explained -an unusual or allergic reaction to levonorgestrel, other hormones, silicone, or polyethylene, medicines, foods, dyes, or preservatives -pregnant or trying to get pregnant -breast-feeding How should I use this medicine? This device is placed inside the uterus by a health care professional. Talk to your pediatrician regarding the use of this medicine in children. Special care may be needed. Overdosage: If you think you have taken too much of this medicine contact a poison control center or emergency room at once. NOTE: This medicine is only for you. Do not share this medicine with others. What if I miss a dose? This does not apply. What may interact with this medicine? Do not take this medicine with any of the following medications: -amprenavir -bosentan -fosamprenavir This medicine may also interact with  the following medications: -aprepitant -barbiturate medicines for inducing sleep or treating seizures -bexarotene -griseofulvin -medicines to treat seizures like carbamazepine, ethotoin, felbamate, oxcarbazepine, phenytoin, topiramate -modafinil -pioglitazone -rifabutin -rifampin -rifapentine -some medicines to treat HIV infection like atazanavir, indinavir, lopinavir, nelfinavir, tipranavir, ritonavir -St. John's wort -warfarin This list may not describe all possible interactions. Give your health care provider a list of all the medicines, herbs, non-prescription drugs, or dietary supplements you use. Also tell them if you smoke, drink alcohol, or use illegal drugs. Some items may interact with your medicine. What should I watch for while using this medicine? Visit your doctor or health care professional for regular check ups. See your doctor if you or your partner has sexual contact with others, becomes HIV positive, or gets a sexual transmitted disease. This product does not protect you against HIV infection (AIDS) or other sexually transmitted diseases. You can check the placement of the IUD yourself by reaching up to the top of your vagina with clean fingers to feel the threads. Do not pull on the threads. It is a good habit to check placement after each menstrual period. Call your doctor right away if you feel more of the IUD than just the threads or if you cannot feel the threads at all. The IUD may come out by itself. You may become pregnant if the device comes out. If you notice that the IUD has come out use a backup birth control method like condoms and call your health care provider. Using tampons will not change the position of the IUD and are okay to use during your period. What side effects may I notice from receiving this medicine?   Side effects that you should report to your doctor or health care professional as soon as possible: -allergic reactions like skin rash, itching or  hives, swelling of the face, lips, or tongue -fever, flu-like symptoms -genital sores -high blood pressure -no menstrual period for 6 weeks during use -pain, swelling, warmth in the leg -pelvic pain or tenderness -severe or sudden headache -signs of pregnancy -stomach cramping -sudden shortness of breath -trouble with balance, talking, or walking -unusual vaginal bleeding, discharge -yellowing of the eyes or skin Side effects that usually do not require medical attention (report to your doctor or health care professional if they continue or are bothersome): -acne -breast pain -change in sex drive or performance -changes in weight -cramping, dizziness, or faintness while the device is being inserted -headache -irregular menstrual bleeding within first 3 to 6 months of use -nausea This list may not describe all possible side effects. Call your doctor for medical advice about side effects. You may report side effects to FDA at 1-800-FDA-1088. Where should I keep my medicine? This does not apply. NOTE: This sheet is a summary. It may not cover all possible information. If you have questions about this medicine, talk to your doctor, pharmacist, or health care provider.    2016, Elsevier/Gold Standard. (2011-01-22 13:54:04) Laparoscopically Assisted Vaginal Hysterectomy A laparoscopically assisted vaginal hysterectomy (LAVH) is a surgical procedure to remove the uterus and cervix, and sometimes the ovaries and fallopian tubes. During an LAVH, some of the surgical removal is done through the vagina, and the rest is done through a few small surgical cuts (incisions) in the abdomen.  This procedure is usually considered in women when a vaginal hysterectomy is not an option. Your health care provider will discuss the risks and benefits of the different surgical techniques at your appointment. Generally, recovery time is faster and there are fewer complications after laparoscopic procedures than  after open incisional procedures. LET [facility] PROVIDER KNOW ABOUT:   Any allergies you have.  All medicines you are taking, including vitamins, herbs, eye drops, creams, and over-the-counter medicines.  Previous problems you or members of your family have had with the use of anesthetics.  Any blood disorders you have.  Previous surgeries you have had.  Medical conditions you have. RISKS AND COMPLICATIONS Generally, this is a safe procedure. However, as with any procedure, complications can occur. Possible complications include:  Allergies to medicines.  Difficulty breathing.  Bleeding.  Infection.  Damage to other structures near your uterus and cervix. BEFORE THE PROCEDURE  Ask your health care provider about changing or stopping your regular medicines.  Take certain medicines, such as a colon-emptying preparation, as directed.  Do not eat or drink anything for at least 8 hours before your surgery.  Stop smoking if you smoke. Stopping will improve your health after surgery.  Arrange for a ride home after surgery and for help at home during recovery. PROCEDURE   An IV tube will be put into one of your veins in order to give you fluids and medicines.  You will receive medicines to relax you and medicines that make you sleep (general anesthetic).  You may have a flexible tube (catheter) put into your bladder to drain urine.  You may have a tube put through your nose or mouth that goes into your stomach (nasogastric tube). The nasogastric tube removes digestive fluids and prevents you from feeling nauseated and from vomiting.  Tight-fitting (compression) stockings will be placed on your legs to promote circulation.  Three to four small incisions will be made in your abdomen. An incision also will be made in your vagina. Probes and tools will be inserted into the small incisions. The uterus and cervix are removed (and possibly your ovaries and fallopian tubes)  through your vagina as well as through the small incisions that were made in the abdomen.  Your vagina is then sewn back to normal. AFTER THE PROCEDURE  You may have a liquid diet temporarily. You will most likely return to, and tolerate, your usual diet the day after surgery.  You will be passing urine through a catheter. It will be removed the day after surgery.  Your temperature, breathing rate, heart rate, blood pressure, and oxygen level will be monitored regularly.  You will still wear compression stockings on your legs until you are able to move around.  You will use a special device or do breathing exercises to keep your lungs clear.  You will be encouraged to walk as soon as possible.   This information is not intended to replace advice given to you by your health care provider. Make sure you discuss any questions you have with your health care provider.   Document Released: 12/11/2010 Document Revised: 01/12/2014 Document Reviewed: 07/07/2012 Elsevier Interactive Patient Education 2016 Elsevier Inc. Tranexamic acid oral tablets What is this medicine? TRANEXAMIC ACID (TRAN ex AM ik AS id) slows down or stops blood clots from being broken down. This medicine is used to treat heavy monthly menstrual bleeding. This medicine may be used for other purposes; ask your health care provider or pharmacist if you have questions. What should I tell my health care provider before I take this medicine? They need to know if you have any of these conditions: -bleeding in the brain -blood clotting problems -kidney disease -vision problems -an unusual allergic reaction to tranexamic acid, other medicines, foods, dyes, or preservatives -pregnant or trying to get pregnant -breast-feeding How should I use this medicine? Take this medicine by mouth with a glass of water. Follow the directions on the prescription label. Do not cut, crush, or chew this medicine. You can take it with or without  food. If it upsets your stomach, take it with food. Take your medicine at regular intervals. Do not take it more often than directed. Do not stop taking except on your doctor's advice. Do not take this medicine until your period has started. Do not take it for more than 5 days in a row. Do not take this medicine when you do not have your period. Talk to your pediatrician regarding the use of this medicine in children. While this drug may be prescribed for female children as young as 53 years of age for selected conditions, precautions do apply. Overdosage: If you think you have taken too much of this medicine contact a poison control center or emergency room at once. NOTE: This medicine is only for you. Do not share this medicine with others. What if I miss a dose? If you miss a dose, take it when you remember, and then take your next dose at least 6 hours later. Do not take more than 2 tablets at a time to make up for missed doses. What may interact with this medicine? Do not take this medicine with any of the following medications: -estrogens -birth control pills, patches, injections, rings or other devices that contain both an estrogen and a progestin This medicine may also interact with the following medications: -certain medicines used to help your  blood clot -tretinoin (taken by mouth) This list may not describe all possible interactions. Give your health care provider a list of all the medicines, herbs, non-prescription drugs, or dietary supplements you use. Also tell them if you smoke, drink alcohol, or use illegal drugs. Some items may interact with your medicine. What should I watch for while using this medicine? Tell your doctor or healthcare professional if your symptoms do not start to get better or if they get worse. Tell your doctor or healthcare professional if you notice any eye problems while taking this medicine. Your doctor will refer you to an eye doctor who will examine your  eyes. What side effects may I notice from receiving this medicine? Side effects that you should report to your doctor or health care professional as soon as possible: -allergic reactions like skin rash, itching or hives, swelling of the face, lips, or tongue -breathing difficulties -changes in vision -sudden or severe pain in the chest, legs, head, or groin -unusually weak or tired Side effects that usually do not require medical attention (Report these to your doctor or health care professional if they continue or are bothersome.): -back pain -headache -muscle or joint aches -sinus and nasal problems -stomach pain -tiredness This list may not describe all possible side effects. Call your doctor for medical advice about side effects. You may report side effects to FDA at 1-800-FDA-1088. Where should I keep my medicine? Keep out of the reach of children. Store at room temperature between 15 and 30 degrees C (59 and 86 degrees F). Throw away any unused medicine after the expiration date. NOTE: This sheet is a summary. It may not cover all possible information. If you have questions about this medicine, talk to your doctor, pharmacist, or health care provider.    2016, Elsevier/Gold Standard. (2013-06-15 14:14:28)

## 2014-11-22 LAB — URINALYSIS W MICROSCOPIC + REFLEX CULTURE
Bacteria, UA: NONE SEEN [HPF]
Bilirubin Urine: NEGATIVE
Casts: NONE SEEN [LPF]
Crystals: NONE SEEN [HPF]
GLUCOSE, UA: NEGATIVE
Hgb urine dipstick: NEGATIVE
Ketones, ur: NEGATIVE
LEUKOCYTES UA: NEGATIVE
NITRITE: NEGATIVE
PH: 5.5 (ref 5.0–8.0)
Protein, ur: NEGATIVE
RBC / HPF: NONE SEEN RBC/HPF (ref <=2)
Specific Gravity, Urine: 1.029 (ref 1.001–1.035)
Squamous Epithelial / LPF: NONE SEEN [HPF] (ref <=5)
WBC, UA: NONE SEEN WBC/HPF (ref <=5)
YEAST: NONE SEEN [HPF]

## 2014-12-03 ENCOUNTER — Other Ambulatory Visit: Payer: Self-pay | Admitting: Gynecology

## 2014-12-03 DIAGNOSIS — N939 Abnormal uterine and vaginal bleeding, unspecified: Secondary | ICD-10-CM

## 2014-12-11 ENCOUNTER — Telehealth: Payer: Self-pay | Admitting: Gynecology

## 2014-12-11 NOTE — Telephone Encounter (Signed)
12/11/14- I LM VM cell for pt [facility] with her $20.00 copay.wl

## 2014-12-20 ENCOUNTER — Ambulatory Visit (INDEPENDENT_AMBULATORY_CARE_PROVIDER_SITE_OTHER): Payer: BLUE CROSS/BLUE SHIELD

## 2014-12-20 ENCOUNTER — Ambulatory Visit (INDEPENDENT_AMBULATORY_CARE_PROVIDER_SITE_OTHER): Payer: BLUE CROSS/BLUE SHIELD | Admitting: Gynecology

## 2014-12-20 ENCOUNTER — Encounter: Payer: Self-pay | Admitting: Gynecology

## 2014-12-20 DIAGNOSIS — N920 Excessive and frequent menstruation with regular cycle: Secondary | ICD-10-CM

## 2014-12-20 DIAGNOSIS — R799 Abnormal finding of blood chemistry, unspecified: Secondary | ICD-10-CM

## 2014-12-20 DIAGNOSIS — N946 Dysmenorrhea, unspecified: Secondary | ICD-10-CM | POA: Diagnosis not present

## 2014-12-20 DIAGNOSIS — N939 Abnormal uterine and vaginal bleeding, unspecified: Secondary | ICD-10-CM | POA: Diagnosis not present

## 2014-12-20 LAB — BUN: BUN: 28 mg/dL — ABNORMAL HIGH (ref 7–25)

## 2014-12-20 MED ORDER — TRANEXAMIC ACID 650 MG PO TABS: ORAL_TABLET | ORAL | Status: DC

## 2014-12-20 NOTE — Progress Notes (Signed)
   Patient is a 44 year old was seen in the office on November 16 for annual gynecological examination she was complaining of dysmenorrhea and menorrhagia for 7 days. She also was complain some left lower quadrant discomfort at times.Review of her record indicated that patient has a history of laparoscopic left salpingo-oophorectomy in 2012 for removal of dermoid cyst. Also patient in May 2013 had resectoscopic polypectomy and endometrial ablation has done well. No prior history of abnormal Pap smears. She reports no intermenstrual bleeding. She is here for sonohysterogram and endometrial biopsy. We had discussed that if there is enough room in the uterine cavity after ablation a consideration for her menorrhagia would be to place a Mirena IUD or to try first Lysteda during her menses. Also review of her recent blood work at time of her annual exam was normal with the exception of a slightly elevated BUN of 29.  Ultrasound/sono hysterogram: Uterus measured 10 x 5.7 x 4.9 cm with endometrial stripe of 4.8 mm. Right ovary normal left ovary absent from previous salpingo-oophorectomy. No fluid in the cul-de-sac. The cervix was then cleansed with Betadine solution a sterile catheter was introduced into the uterine cavity and no intrauterine cavity defect noted. After this a single-tooth tenaculum was placed on the anterior cervical lip and a Pipelle was introduced into the uterine cavity and tissue was submitted for histological evaluation. A single-tooth tenaculum was then removed.  Assessment/plan: #1 slightly elevated BUN we'll recheck today measure patient was adequately hydrated #2 menorrhagia prior history of endometrial ablation in resectoscopic polypectomy no abnormality noted in sonohysterogram. #3 patient given the following options: #1 first tried Lysteda 650 mg 2 tablets 3 times a day for 5 days during her cycle Option #2 Mirena IUD #3 have all options fail laparoscopic-assisted vaginal  hysterectomy.

## 2014-12-21 ENCOUNTER — Telehealth: Payer: Self-pay | Admitting: *Deleted

## 2014-12-21 ENCOUNTER — Other Ambulatory Visit: Payer: Self-pay | Admitting: Gynecology

## 2014-12-21 DIAGNOSIS — R799 Abnormal finding of blood chemistry, unspecified: Secondary | ICD-10-CM

## 2014-12-21 NOTE — Telephone Encounter (Signed)
Misty from pathology called to confirm the site on OV 12/20/14, I called and left message on her voicemail that site was endometrial bx.

## 2014-12-28 ENCOUNTER — Other Ambulatory Visit: Payer: BLUE CROSS/BLUE SHIELD

## 2014-12-28 DIAGNOSIS — R799 Abnormal finding of blood chemistry, unspecified: Secondary | ICD-10-CM

## 2014-12-28 LAB — BUN: BUN: 16 mg/dL (ref 7–25)

## 2015-01-28 ENCOUNTER — Telehealth: Payer: Self-pay | Admitting: Gynecology

## 2015-01-28 NOTE — Telephone Encounter (Signed)
01/28/15-I LM VM for pt that her [facility] will cover the Mirena for contraception(per JF dx is contraception and menorrhagia) at 100%. Told pt needs to be inserted while on cycle and for her to call if she wishes to proceed. Per [email or web address] . Ref#[phone].wl

## 2015-11-17 ENCOUNTER — Inpatient Hospital Stay (HOSPITAL_COMMUNITY)
Admission: AD | Admit: 2015-11-17 | Discharge: 2015-11-17 | Disposition: A | Discharge status: Home / Self Care | Payer: BLUE CROSS/BLUE SHIELD | Source: Ambulatory Visit | Attending: Gynecology | Admitting: Gynecology

## 2015-11-17 ENCOUNTER — Encounter (HOSPITAL_COMMUNITY): Payer: Self-pay

## 2015-11-17 DIAGNOSIS — N946 Dysmenorrhea, unspecified: Secondary | ICD-10-CM | POA: Diagnosis not present

## 2015-11-17 DIAGNOSIS — N920 Excessive and frequent menstruation with regular cycle: Secondary | ICD-10-CM

## 2015-11-17 DIAGNOSIS — Z880 Allergy status to penicillin: Secondary | ICD-10-CM | POA: Insufficient documentation

## 2015-11-17 HISTORY — DX: Fibromyalgia: M79.7

## 2015-11-17 LAB — URINALYSIS, ROUTINE W REFLEX MICROSCOPIC
Bilirubin Urine: NEGATIVE
GLUCOSE, UA: NEGATIVE mg/dL
KETONES UR: NEGATIVE mg/dL
LEUKOCYTES UA: NEGATIVE
Nitrite: NEGATIVE
PROTEIN: NEGATIVE mg/dL
Specific Gravity, Urine: 1.015 (ref 1.005–1.030)
pH: 6.5 (ref 5.0–8.0)

## 2015-11-17 LAB — URINE MICROSCOPIC-ADD ON

## 2015-11-17 LAB — CBC WITH DIFFERENTIAL/PLATELET
Basophils Absolute: 0 10*3/uL (ref 0.0–0.1)
Basophils Relative: 0 %
Eosinophils Absolute: 0.1 10*3/uL (ref 0.0–0.7)
Eosinophils Relative: 2 %
HEMATOCRIT: 37.1 % (ref 36.0–46.0)
HEMOGLOBIN: 12.9 g/dL (ref 12.0–15.0)
LYMPHS ABS: 1.6 10*3/uL (ref 0.7–4.0)
Lymphocytes Relative: 18 %
MCH: 30.4 pg (ref 26.0–34.0)
MCHC: 34.8 g/dL (ref 30.0–36.0)
MCV: 87.3 fL (ref 78.0–100.0)
MONO ABS: 0.3 10*3/uL (ref 0.1–1.0)
MONOS PCT: 4 %
NEUTROS ABS: 6.9 10*3/uL (ref 1.7–7.7)
Neutrophils Relative %: 76 %
Platelets: 175 10*3/uL (ref 150–400)
RBC: 4.25 MIL/uL (ref 3.87–5.11)
RDW: 13.5 % (ref 11.5–15.5)
WBC: 9 10*3/uL (ref 4.0–10.5)

## 2015-11-17 LAB — POCT PREGNANCY, URINE: PREG TEST UR: NEGATIVE

## 2015-11-17 MED ORDER — KETOROLAC TROMETHAMINE 60 MG/2ML IM SOLN
60.0000 mg | Freq: Once | INTRAMUSCULAR | Status: DC
Start: 2015-11-17 — End: 2015-11-17
  Filled 2015-11-17: qty 2

## 2015-11-17 MED ORDER — KETOROLAC TROMETHAMINE 10 MG PO TABS: 10.0000 mg | ORAL_TABLET | Freq: Four times a day (QID) | ORAL | 0 refills | Status: DC | PRN

## 2015-11-17 NOTE — MAU Provider Note (Signed)
Chief Complaint: Vaginal Bleeding and Abdominal Pain   First Provider Initiated Contact with Patient 11/17/15 1221     SUBJECTIVE HPI: Lindsey Chan is a 45 y.o. G80P2002 female who presents to Maternity Admissions reporting heavy bleeding and severe LLQ cramping similar to usual menorrhagia, but worse. Started what seemed like a normal period. Yesterday. Has Hx menorrhagia. Has Rx for Listeda, but rarely needs to use it. Pain was 10/10 this morning. Took Ibuprofen 400 mg around 9:00 am. Not improvement w/in a few hours so she came to MAU. Pain has since nearly resolved.   S/P left oophorectomy for dermoid. Last [place] 10/2014.   Location: LLQ Quality: cramping Severity: 10/10 on pain scale at worst, now minimal. Duration: 24 hours Course: Worsening this morning, improved in past hour. Radiation: Down left leg.  Context: Menstrual period Timing: constant w/ intermittent exacerbations.  Modifying factors: Delayed improvement w/ Ibuprofen Associated signs and symptoms: pos for heavy vaginal bleeding and nausea. Neg for fever, chills, vomiting, diarrhea, constipation, urinary complaints, change in appetite.   Past Medical History:  Diagnosis Date  . Asthma   . Fibromyalgia    OB History  Gravida Para Term Preterm AB Living  2 2 2     2   SAB TAB Ectopic Multiple Live Births          2    # Outcome Date GA Lbr Len/2nd Weight Sex Delivery Anes PTL Lv  2 Term     F Vag-Spont  N LIV  1 Term     F Vag-Spont  N LIV     Past Surgical History:  Procedure Laterality Date  . FACIAL RECONSTRUCTION SURGERY     BROKEN CHEEK  . PELVIC LAPAROSCOPY  01/20/10   LSO/ MATURE TERATOMA   Social History   Social History  . Marital status: Married    Spouse name: Arlys John  . Number of children: 2  . Years of education: college   Occupational History  .  [employer]   Social History Main Topics  . Smoking status: Never Smoker  . Smokeless tobacco: Never Used  . Alcohol use No  .  Drug use: No  . Sexual activity: Yes     Comment: patient's husband with vasectomy   Other Topics Concern  . Not on file   Social History Narrative   [name]).   [employer]- Works Full time   Right handed.   Caffeine- None   Education- College            Family History  Problem Relation Age of Onset  . Hypertension Mother    No current facility-administered medications on file prior to encounter.    Current Outpatient Prescriptions on File Prior to Encounter  Medication Sig Dispense Refill  . ALBUTEROL SULFATE IN Inhale 1-2 puffs into the lungs every 4 (four) hours as needed. For wheezing    . tranexamic acid (LYSTEDA) 650 MG TABS tablet Take 2 tablets 3 times a day during menses 30 tablet 12   Allergies  Allergen Reactions  . Clarithromycin Shortness Of Breath  . Naproxen Shortness Of Breath    Takes ibuprofen at home  . Clindamycin/Lincomycin Hives  . Levaquin [Levofloxacin In D5w]     Joint pain  . Amoxicillin Rash    Has patient had a PCN reaction causing immediate rash, facial/tongue/throat swelling, SOB or lightheadedness with hypotension: Yes Has patient had a PCN reaction causing severe rash involving mucus membranes  or skin necrosis: No Has patient had a PCN reaction that required hospitalization No Has patient had a PCN reaction occurring within the last 10 years: No If all of the above answers are "NO", then may proceed with Cephalosporin use.     I have reviewed patient's Past Medical Hx, Surgical Hx, Family Hx, Social Hx, medications and allergies.   Review of Systems  Constitutional: Negative for appetite change, chills and fever.  Gastrointestinal: Positive for abdominal pain and nausea. Negative for abdominal distention, blood in stool, constipation, diarrhea and vomiting.  Genitourinary: Positive for vaginal bleeding. Negative for dysuria, frequency, hematuria, urgency and vaginal discharge.  Musculoskeletal:  Negative for back pain.  Neurological: Negative for dizziness.    OBJECTIVE Patient Vitals for the past 24 hrs:  BP Temp Pulse Resp Height Weight  11/17/15 1117 136/86 98.2 F (36.8 C) (!) 54 18 5\' 6"  (1.676 m) 184 lb (83.5 kg)   Constitutional: Well-developed, well-nourished female in no acute distress.  Skin: No pallor Cardiovascular: Mild bradycardia Respiratory: normal rate and effort.  GI: Abd soft, non-tender, Pos BS x 4 MS: Extremities nontender, no edema, normal ROM Neurologic: Alert and oriented x 4.  GU: Neg CVAT.  SPECULUM EXAM: NEFG, moderate amount of blood noted, cervix clean  BIMANUAL: cervix closed; uterus ?slightly enlarged, no adnexal tenderness or masses. No CMT.  LAB RESULTS Results for orders placed or performed during the hospital encounter of 11/17/15 (from the past 24 hour(s))  Urinalysis, Routine w reflex microscopic (not at [facility])     Status: Abnormal   Collection Time: 11/17/15 11:20 AM  Result Value Ref Range   Color, Urine YELLOW YELLOW   APPearance CLEAR CLEAR   Specific Gravity, Urine 1.015 1.005 - 1.030   pH 6.5 5.0 - 8.0   Glucose, UA NEGATIVE NEGATIVE mg/dL   Hgb urine dipstick LARGE (A) NEGATIVE   Bilirubin Urine NEGATIVE NEGATIVE   Ketones, ur NEGATIVE NEGATIVE mg/dL   Protein, ur NEGATIVE NEGATIVE mg/dL   Nitrite NEGATIVE NEGATIVE   Leukocytes, UA NEGATIVE NEGATIVE  Urine microscopic-add on     Status: Abnormal   Collection Time: 11/17/15 11:20 AM  Result Value Ref Range   Squamous Epithelial / LPF 0-5 (A) NONE SEEN   WBC, UA 0-5 0 - 5 WBC/hpf   RBC / HPF 6-30 0 - 5 RBC/hpf   Bacteria, UA RARE (A) NONE SEEN  Pregnancy, urine POC     Status: None   Collection Time: 11/17/15 11:25 AM  Result Value Ref Range   Preg Test, Ur NEGATIVE NEGATIVE  CBC with Differential/Platelet     Status: None   Collection Time: 11/17/15 12:30 PM  Result Value Ref Range   WBC 9.0 4.0 - 10.5 K/uL   RBC 4.25 3.87 - 5.11 MIL/uL   Hemoglobin 12.9 12.0 -  15.0 g/dL   HCT 16.137.1 09.636.0 - 04.546.0 %   MCV 87.3 78.0 - 100.0 fL   MCH 30.4 26.0 - 34.0 pg   MCHC 34.8 30.0 - 36.0 g/dL   RDW 40.913.5 81.111.5 - 91.415.5 %   Platelets 175 150 - 400 K/uL   Neutrophils Relative % 76 %   Neutro Abs 6.9 1.7 - 7.7 K/uL   Lymphocytes Relative 18 %   Lymphs Abs 1.6 0.7 - 4.0 K/uL   Monocytes Relative 4 %   Monocytes Absolute 0.3 0.1 - 1.0 K/uL   Eosinophils Relative 2 %   Eosinophils Absolute 0.1 0.0 - 0.7 K/uL   Basophils Relative 0 %  Basophils Absolute 0.0 0.0 - 0.1 K/uL    IMAGING No results found.  MAU COURSE Orders Placed This Encounter  Procedures  . Urinalysis, Routine w reflex microscopic (not at [facility])  . Urine microscopic-add on  . CBC with Differential/Platelet  . Pregnancy, urine POC  . Discharge patient   Declined pain meds.   Discussed history, pelvic exam, labs with Dr. Lily PeerFernandez. No new orders. Agrees with plan of care.  MDM - Exacerbation of chronic menorrhagia and dysmenorrhea, both of which improved with lisinopril and ibuprofen. Hemodynamically stable. No evidence of surgical abdomen.  ASSESSMENT 1. Menorrhagia with regular cycle   2. Dysmenorrhea     PLAN Discharge home in stable conditionPer consult with Dr. Lily PeerFernandez. Leading and abdominal pain precautions.  Take Lysteda and ibuprofen (or Toradol as needed for more severe pain) on schedule with menstrual periods. Will discuss long-term management at annual GYN appointment due in December 2017. Follow-up Information    Ok EdwardsFERNANDEZ,JUAN H, MD Follow up in 2 week(s).   Specialties:  Gynecology, Radiology Why:  Call to scheduled follow-up visit and annual exam.  Contact information: [address] [place] [id number] [phone]        [facility] Follow up.   Why:  as needed in emergencies Contact information: [address] [id number] mc [place] [id number] [phone]            Medication List    TAKE these medications   ALBUTEROL SULFATE IN Inhale 1-2 puffs into the lungs every 4 (four) hours as needed. For wheezing   PROVENTIL HFA 108 (90 Base) MCG/ACT inhaler Generic drug:  albuterol INHALE TWO PUFFS EVERY 6 HOURS AS NEEDED   ketorolac 10 MG tablet Commonly known as:  TORADOL Take 1 tablet (10 mg total) by mouth every 6 (six) hours as needed.   MIDOL 200 MG Caps Generic drug:  Ibuprofen Take by mouth.   tranexamic acid 650 MG Tabs tablet Commonly known as:  LYSTEDA Take 2 tablets 3 times a day during menses        [place] Gassert, CNM 11/17/2015  1:24 PM

## 2015-11-17 NOTE — Discharge Instructions (Signed)
Dysfunctional Uterine Bleeding °Dysfunctional uterine bleeding is abnormal bleeding from the uterus. Dysfunctional uterine bleeding includes: °· A period that comes earlier or later than usual. °· A period that is lighter, heavier, or has blood clots. °· Bleeding between periods. °· Skipping one or more periods. °· Bleeding after sexual intercourse. °· Bleeding after menopause. °HOME CARE INSTRUCTIONS  °Pay attention to any changes in your symptoms. Follow these instructions to help with your condition: °Eating °· Eat well-balanced meals. Include foods that are high in iron, such as liver, meat, shellfish, green leafy vegetables, and eggs. °· If you become constipated: °¨ Drink plenty of water. °¨ Eat fruits and vegetables that are high in water and fiber, such as spinach, carrots, raspberries, apples, and mango. °Medicines °· Take over-the-counter and prescription medicines only as told by your health care provider. °· Do not change medicines without talking with your health care provider. °· Aspirin or medicines that contain aspirin may make the bleeding worse. Do not take those medicines: °¨ During the week before your period. °¨ During your period. °· If you were prescribed iron pills, take them as told by your health care provider. Iron pills help to replace iron that your body loses because of this condition. °Activity °· If you need to change your sanitary pad or tampon more than one time every 2 hours: °¨ Lie in bed with your feet raised (elevated). °¨ Place a cold pack on your lower abdomen. °¨ Rest as much as possible until the bleeding stops or slows down. °· Do not try to lose weight until the bleeding has stopped and your blood iron level is back to normal. °Other Instructions °· For two months, write down: °¨ When your period starts. °¨ When your period ends. °¨ When any abnormal bleeding occurs. °¨ What problems you notice. °· Keep all follow up visits as told by your health care provider. This is  important. °SEEK MEDICAL CARE IF: °· You get light-headed or weak. °· You have nausea and vomiting. °· You cannot eat or drink without vomiting. °· You feel dizzy or have diarrhea while you are taking medicines. °· You are taking birth control pills or hormones, and you want to change them or stop taking them. °SEEK IMMEDIATE MEDICAL CARE IF: °· You develop a fever or chills. °· You need to change your sanitary pad or tampon more than one time per hour. °· Your bleeding becomes heavier, or your flow contains clots more often. °· You develop pain in your abdomen. °· You lose consciousness. °· You develop a rash. °  °This information is not intended to replace advice given to you by your health care provider. Make sure you discuss any questions you have with your health care provider. °  °Document Released: 12/20/1999 Document Revised: 09/12/2014 Document Reviewed: 03/19/2014 °Elsevier Interactive Patient Education ©2016 Elsevier Inc. ° ° °Dysmenorrhea °Menstrual cramps (dysmenorrhea) are caused by the muscles of the uterus tightening (contracting) during a menstrual period. For some women, this discomfort is merely bothersome. For others, dysmenorrhea can be severe enough to interfere with everyday activities for a few days each month. °Primary dysmenorrhea is menstrual cramps that last a couple of days when you start having menstrual periods or soon after. This often begins after a teenager starts having her period. As a woman gets older or has a baby, the cramps will usually lessen or disappear. Secondary dysmenorrhea begins later in life, lasts longer, and the pain may be stronger than primary dysmenorrhea. The   pain may start before the period and last a few days after the period.  CAUSES  Dysmenorrhea is usually caused by an underlying problem, such as:  The tissue lining the uterus grows outside of the uterus in other areas of the body (endometriosis).  The endometrial tissue, which normally lines the  uterus, is found in or grows into the muscular walls of the uterus (adenomyosis).  The pelvic blood vessels are engorged with blood just before the menstrual period (pelvic congestive syndrome).  Overgrowth of cells (polyps) in the lining of the uterus or cervix.  Falling down of the uterus (prolapse) because of loose or stretched ligaments.  Depression.  Bladder problems, infection, or inflammation.  Problems with the intestine, a tumor, or irritable bowel syndrome.  Cancer of the female organs or bladder.  A severely tipped uterus.  A very tight opening or closed cervix.  Noncancerous tumors of the uterus (fibroids).  Pelvic inflammatory disease (PID).  Pelvic scarring (adhesions) from a previous surgery.  Ovarian cyst.  An intrauterine device (IUD) used for birth control. RISK FACTORS You may be at greater risk of dysmenorrhea if:  You are younger than age 45.  You started puberty early.  You have irregular or heavy bleeding.  You have never given birth.  You have a family history of this problem.  You are a smoker. SIGNS AND SYMPTOMS   Cramping or throbbing pain in your lower abdomen.  Headaches.  Lower back pain.  Nausea or vomiting.  Diarrhea.  Sweating or dizziness.  Loose stools. DIAGNOSIS  A diagnosis is based on your history, symptoms, physical exam, diagnostic tests, or procedures. Diagnostic tests or procedures may include:  Blood tests.  Ultrasonography.  An examination of the lining of the uterus (dilation and curettage, D&C).  An examination inside your abdomen or pelvis with a scope (laparoscopy).  X-rays.  CT scan.  MRI.  An examination inside the bladder with a scope (cystoscopy).  An examination inside the intestine or stomach with a scope (colonoscopy, gastroscopy). TREATMENT  Treatment depends on the cause of the dysmenorrhea. Treatment may include:  Pain medicine prescribed by your health care provider.  Birth  control pills or an IUD with progesterone hormone in it.  Hormone replacement therapy.  Nonsteroidal anti-inflammatory drugs (NSAIDs). These may help stop the production of prostaglandins.  Surgery to remove adhesions, endometriosis, ovarian cyst, or fibroids.  Removal of the uterus (hysterectomy).  Progesterone shots to stop the menstrual period.  Cutting the nerves on the sacrum that go to the female organs (presacral neurectomy).  Electric current to the sacral nerves (sacral nerve stimulation).  Antidepressant medicine.  Psychiatric therapy, counseling, or group therapy.  Exercise and physical therapy.  Meditation and yoga therapy.  Acupuncture. HOME CARE INSTRUCTIONS   Only take over-the-counter or prescription medicines as directed by your health care provider.  Place a heating pad or hot water bottle on your lower back or abdomen. Do not sleep with the heating pad.  Use aerobic exercises, walking, swimming, biking, and other exercises to help lessen the cramping.  Massage to the lower back or abdomen may help.  Stop smoking.  Avoid alcohol and caffeine. SEEK MEDICAL CARE IF:   Your pain does not get better with medicine.  You have pain with sexual intercourse.  Your pain increases and is not controlled with medicines.  You have abnormal vaginal bleeding with your period.  You develop nausea or vomiting with your period that is not controlled with medicine. SEEK IMMEDIATE  MEDICAL CARE IF:  You pass out.    This information is not intended to replace advice given to you by your health care provider. Make sure you discuss any questions you have with your health care provider.   Document Released: 12/22/2004 Document Revised: 08/24/2012 Document Reviewed: 06/09/2012 Elsevier Interactive Patient Education 2016 Elsevier Inc.  Tranexamic acid oral tablets What is this medicine? TRANEXAMIC ACID (TRAN ex AM ik AS id) slows down or stops blood clots from being  broken down. This medicine is used to treat heavy monthly menstrual bleeding. This medicine may be used for other purposes; ask your health care provider or pharmacist if you have questions. What should I tell my health care provider before I take this medicine? They need to know if you have any of these conditions: -bleeding in the brain -blood clotting problems -kidney disease -vision problems -an unusual allergic reaction to tranexamic acid, other medicines, foods, dyes, or preservatives -pregnant or trying to get pregnant -breast-feeding How should I use this medicine? Take this medicine by mouth with a glass of water. Follow the directions on the prescription label. Do not cut, crush, or chew this medicine. You can take it with or without food. If it upsets your stomach, take it with food. Take your medicine at regular intervals. Do not take it more often than directed. Do not stop taking except on your doctor's advice. Do not take this medicine until your period has started. Do not take it for more than 5 days in a row. Do not take this medicine when you do not have your period. Talk to your pediatrician regarding the use of this medicine in children. While this drug may be prescribed for female children as young as 412 years of age for selected conditions, precautions do apply. Overdosage: If you think you have taken too much of this medicine contact a poison control center or emergency room at once. NOTE: This medicine is only for you. Do not share this medicine with others. What if I miss a dose? If you miss a dose, take it when you remember, and then take your next dose at least 6 hours later. Do not take more than 2 tablets at a time to make up for missed doses. What may interact with this medicine? Do not take this medicine with any of the following medications: -estrogens -birth control pills, patches, injections, rings or other devices that contain both an estrogen and a  progestin This medicine may also interact with the following medications: -certain medicines used to help your blood clot -tretinoin (taken by mouth) This list may not describe all possible interactions. Give your health care provider a list of all the medicines, herbs, non-prescription drugs, or dietary supplements you use. Also tell them if you smoke, drink alcohol, or use illegal drugs. Some items may interact with your medicine. What should I watch for while using this medicine? Tell your doctor or healthcare professional if your symptoms do not start to get better or if they get worse. Tell your doctor or healthcare professional if you notice any eye problems while taking this medicine. Your doctor will refer you to an eye doctor who will examine your eyes. What side effects may I notice from receiving this medicine? Side effects that you should report to your doctor or health care professional as soon as possible: -allergic reactions like skin rash, itching or hives, swelling of the face, lips, or tongue -breathing difficulties -changes in vision -sudden or severe pain  in the chest, legs, head, or groin -unusually weak or tired Side effects that usually do not require medical attention (Report these to your doctor or health care professional if they continue or are bothersome.): -back pain -headache -muscle or joint aches -sinus and nasal problems -stomach pain -tiredness This list may not describe all possible side effects. Call your doctor for medical advice about side effects. You may report side effects to FDA at 1-800-FDA-1088. Where should I keep my medicine? Keep out of the reach of children. Store at room temperature between 15 and 30 degrees C (59 and 86 degrees F). Throw away any unused medicine after the expiration date. NOTE: This sheet is a summary. It may not cover all possible information. If you have questions about this medicine, talk to your doctor, pharmacist, or  health care provider.    2016, Elsevier/Gold Standard. (2013-06-15 14:14:28)

## 2015-11-17 NOTE — MAU Note (Signed)
Pt presents to MAU with complaints of pain in the left lower portion of her abdomen that started yesterday when she started her cycle. The pain has gotten severe this morning. Reports heavy bleeding.

## 2015-11-17 NOTE — MAU Note (Signed)
Pt refused Toradol

## 2015-12-22 ENCOUNTER — Other Ambulatory Visit: Payer: Self-pay | Admitting: Gynecology

## 2015-12-23 ENCOUNTER — Other Ambulatory Visit: Payer: Self-pay | Admitting: Gynecology

## 2015-12-24 ENCOUNTER — Telehealth: Payer: Self-pay | Admitting: *Deleted

## 2015-12-24 NOTE — Telephone Encounter (Signed)
error 

## 2016-01-17 ENCOUNTER — Encounter: Payer: BLUE CROSS/BLUE SHIELD | Admitting: Gynecology

## 2016-01-27 ENCOUNTER — Ambulatory Visit (INDEPENDENT_AMBULATORY_CARE_PROVIDER_SITE_OTHER): Payer: BLUE CROSS/BLUE SHIELD | Admitting: Gynecology

## 2016-01-27 ENCOUNTER — Encounter: Payer: Self-pay | Admitting: Gynecology

## 2016-01-27 VITALS — BP 118/80 | Ht 65.0 in | Wt 184.0 lb

## 2016-01-27 DIAGNOSIS — Z01419 Encounter for gynecological examination (general) (routine) without abnormal findings: Secondary | ICD-10-CM | POA: Diagnosis not present

## 2016-01-27 NOTE — Progress Notes (Signed)
Lindsey KailJoanna Chan June 10, 1970 [id number]   History:    46 y.o.  for annual gyn exam  Who for the past several years has suffer from dysmenorrhea and menorrhagia. Her history is as follows:  Patient was last seen the office December 2016:She also was complain some left lower quadrant discomfort at times.Review of her record indicated that patient has a history of laparoscopic left salpingo-oophorectomy in 2012 for removal of dermoid cyst. Also patient in May 2013 had resectoscopic polypectomy and endometrial ablation has done well. No prior history of abnormal Pap smears. She reports no intermenstrual bleeding. She is here for sonohysterogram and endometrial biopsy. We had discussed that if there is enough room in the uterine cavity after ablation a consideration for her menorrhagia would be to place a Mirena IUD or to try first Lysteda during her menses. Also review of her recent blood work at time of her annual exam was normal with the exception of a slightly elevated BUN of 29.  Ultrasound/sono hysterogram: Uterus measured 10 x 5.7 x 4.9 cm with endometrial stripe of 4.8 mm. Right ovary normal left ovary absent from previous salpingo-oophorectomy. No fluid in the cul-de-sac. The cervix was then cleansed with Betadine solution a sterile catheter was introduced into the uterine cavity and no intrauterine cavity defect noted. After this a single-tooth tenaculum was placed on the anterior cervical lip and a Pipelle was introduced into the uterine cavity and tissue was submitted for histological evaluation. A single-tooth tenaculum was then removed.  Patient was complaining of menorrhagia lasting for 7 days.  For this reason patient been placed and Lysteda 650 mg 2 tablets 3 times a day for 5 days. She is now interested in returning at a later date to place a Mirena IUD and if that fails she will like to proceed with a hysterectomy. Patient declined flu vaccine. Her PCP has been doing her blood work.  Patient with no past history of any abnormal Pap smears.  Past medical history,surgical history, family history and social history were all reviewed and documented in the EPIC chart.  Gynecologic History Patient's last menstrual period was 01/20/2016. Contraception: vasectomy Last Pap:  2014. Results were: normal Last mammogram:  2016. Results were: normal  Obstetric History OB History  Gravida Para Term Preterm AB Living  2 2 2     2   SAB TAB Ectopic Multiple Live Births          2    # Outcome Date GA Lbr Len/2nd Weight Sex Delivery Anes PTL Lv  2 Term     F Vag-Spont  N LIV  1 Term     F Vag-Spont  N LIV       ROS: A ROS was performed and pertinent positives and negatives are included in the history.  GENERAL: No fevers or chills. HEENT: No change in vision, no earache, sore throat or sinus congestion. NECK: No pain or stiffness. CARDIOVASCULAR: No chest pain or pressure. No palpitations. PULMONARY: No shortness of breath, cough or wheeze. GASTROINTESTINAL: No abdominal pain, nausea, vomiting or diarrhea, melena or bright red blood per rectum. GENITOURINARY: No urinary frequency, urgency, hesitancy or dysuria. MUSCULOSKELETAL: No joint or muscle pain, no back pain, no recent trauma. DERMATOLOGIC: No rash, no itching, no lesions. ENDOCRINE: No polyuria, polydipsia, no heat or cold intolerance. No recent change in weight. HEMATOLOGICAL: No anemia or easy bruising or bleeding. NEUROLOGIC: No headache, seizures, numbness, tingling or weakness. PSYCHIATRIC: No depression, no loss of interest in normal  activity or change in sleep pattern.     Exam: chaperone present  BP 118/80   Ht 5\' 5"  (1.651 m)   Wt 184 lb (83.5 kg)   LMP 01/20/2016   BMI 30.62 kg/m   Body mass index is 30.62 kg/m.  General appearance : Well developed well nourished female. No acute distress HEENT: Eyes: no retinal hemorrhage or exudates,  Neck supple, trachea midline, no carotid bruits, no  thyroidmegaly Lungs: Clear to auscultation, no rhonchi or wheezes, or rib retractions  Heart: Regular rate and rhythm, no murmurs or gallops Breast:Examined in sitting and supine position were symmetrical in appearance, no palpable masses or tenderness,  no skin retraction, no nipple inversion, no nipple discharge, no skin discoloration, no axillary or supraclavicular lymphadenopathy Abdomen: no palpable masses or tenderness, no rebound or guarding Extremities: no edema or skin discoloration or tenderness  Pelvic:  Bartholin, Urethra, Skene Glands: Within normal limits             Vagina: No gross lesions or discharge  Cervix: No gross lesions or discharge  Uterus   anteverted, normal size, shape and consistency, non-tender and mobile  Adnexa  Without masses or tenderness  Anus and perineum  normal   Rectovaginal  normal sphincter tone without palpated masses or tenderness             Hemoccult  Not indicated     Assessment/Plan:  46 y.o. female for annual exam  With heavy menstrual cycles. She would like to return back at the start of her next cycle to place a Mirena IUD. We had done a sonohysterogram with no intracavitary defect and there appeared to be no from to place a Mirena IUD to see if this will help control her cycle. If this fails she would then like to proceed then withlaparoscopic-assisted vaginal hysterectomy. Her Pap smear was done today.   Ok Edwards MD, 4:46 PM 01/27/2016

## 2016-01-28 LAB — PAP IG W/ RFLX HPV ASCU

## 2016-02-07 ENCOUNTER — Encounter: Payer: Self-pay | Admitting: Anesthesiology

## 2016-02-13 ENCOUNTER — Encounter: Payer: Self-pay | Admitting: Gynecology

## 2016-02-25 ENCOUNTER — Encounter: Payer: Self-pay | Admitting: Gynecology

## 2016-02-25 ENCOUNTER — Ambulatory Visit (INDEPENDENT_AMBULATORY_CARE_PROVIDER_SITE_OTHER): Payer: BLUE CROSS/BLUE SHIELD | Admitting: Gynecology

## 2016-02-25 VITALS — BP 118/76

## 2016-02-25 DIAGNOSIS — Z30430 Encounter for insertion of intrauterine contraceptive device: Secondary | ICD-10-CM

## 2016-02-25 DIAGNOSIS — Z975 Presence of (intrauterine) contraceptive device: Secondary | ICD-10-CM | POA: Insufficient documentation

## 2016-02-25 NOTE — Patient Instructions (Signed)

## 2016-02-25 NOTE — Progress Notes (Signed)
   Patient presented to the office as a for placement of her Mirena IUD. Her history is as follows:  Patient suffers from dysmenorrhea and menorrhagia. She was seen also in the office in December 2016. Review of her record indicated that patient has a history of laparoscopic left salpingo-oophorectomy in 2012 for removal of dermoid cyst. Also patient in May 2013 had resectoscopic polypectomy and endometrial ablation has done well. No prior history of abnormal Pap smears. She reports no intermenstrual bleeding.  On the office visit of January 22 she had a sonohysterogram/ultrasound which demonstrated the following: Uterus measured 10 x 5.7 x 4.9 cm with endometrial stripe of 4.8 mm. Right ovary normal left ovary absent from previous salpingo-oophorectomy. No fluid in the cul-de-sac. The cervix was then cleansed with Betadine solution a sterile catheter was introduced into the uterine cavity and no intrauterine cavity defect noted.   Patient did have an endometrial biopsy in December 2016 benign endometrial polyp was noted and no hyperplasia or malignancy reported.   She is here for placement of Mirena IUD if she continues to have dysmenorrhea and menorrhagia in the final alternative would be to proceed with a hysterectomy.                                                                    IUD procedure note       Patient presented to the office today for placement of Mirena IUD. The patient had previously been provided with literature information on this method of contraception. The risks benefits and pros and cons were discussed and all her questions were answered. She is fully aware that this form of contraception is 99% effective and is good for 5 years.  Pelvic exam: Bartholin urethra Skene glands: Within normal limits Vagina: No lesions or discharge Cervix: No lesions or discharge Uterus: Anteverted position Adnexa: No masses or tenderness Rectal exam: Not done  The cervix was cleansed  with Betadine solution. A single-tooth tenaculum was placed on the anterior cervical lip. The uterus sounded to 9 centimeter. The IUD was shown to the patient and inserted in a sterile fashion. The IUD string was trimmed. The single-tooth tenaculum was removed. Patient was instructed to return back to the office in one month for follow up.       Lot number: [id number]

## 2016-02-26 ENCOUNTER — Encounter: Payer: Self-pay | Admitting: Anesthesiology

## 2016-03-24 ENCOUNTER — Ambulatory Visit (INDEPENDENT_AMBULATORY_CARE_PROVIDER_SITE_OTHER): Payer: BLUE CROSS/BLUE SHIELD | Admitting: Gynecology

## 2016-03-24 ENCOUNTER — Encounter: Payer: Self-pay | Admitting: Gynecology

## 2016-03-24 VITALS — BP 118/76

## 2016-03-24 DIAGNOSIS — Z30431 Encounter for routine checking of intrauterine contraceptive device: Secondary | ICD-10-CM

## 2016-03-24 NOTE — Progress Notes (Signed)
   Patient is a [age] with a presented to the office for her 1 month follow-up after placement of Mirena IUD. Her history is as follows:  Patient suffers from dysmenorrhea and menorrhagia. She was seen also in the office in December 2016. Review of her record indicated that patient has a history of laparoscopic left salpingo-oophorectomy in 2012 for removal of dermoid cyst. Also patient in May 2013 had resectoscopic polypectomy and endometrial ablation has done well. No prior history of abnormal Pap smears. She reports no intermenstrual bleeding.  On the office visit of January 22 she had a sonohysterogram/ultrasound which demonstrated the following: Uterus measured 10 x 5.7 x 4.9 cm with endometrial stripe of 4.8 mm. Right ovary normal left ovary absent from previous salpingo-oophorectomy. No fluid in the cul-de-sac. The cervix was then cleansed with Betadine solution a sterile catheter was introduced into the uterine cavity and no intrauterine cavity defect noted.   Patient did have an endometrial biopsy in December 2016 benign endometrial polyp was noted and no hyperplasia or malignancy reported.  Patient's menstrual cycle recently started she had intercourse and no problems with a Mirena IUD.  Exam: Abdomen: Soft nontender no rebound or guarding Pelvic: Bartholin urethra Skene glands within normal limits Vagina: Menstrual blood present Cervix: IUD was visualized Uterus: Anteverted normal size shape and consistency Adnexa: No palpable masses or tenderness Rectal exam: Not done  Assessment/plan: Patient one month status post placement Mirena IUD doing well. Patient otherwise scheduled to return back to the office in January 2019 for her annual exam or when necessary. Her Pap smear mammograms are up-to-date.

## 2016-05-20 ENCOUNTER — Encounter: Payer: Self-pay | Admitting: Gynecology

## 2017-01-27 ENCOUNTER — Encounter: Payer: BLUE CROSS/BLUE SHIELD | Admitting: Obstetrics & Gynecology

## 2017-03-02 ENCOUNTER — Encounter: Payer: Self-pay | Admitting: Obstetrics & Gynecology

## 2017-03-02 ENCOUNTER — Ambulatory Visit: Payer: BLUE CROSS/BLUE SHIELD | Admitting: Obstetrics & Gynecology

## 2017-03-02 VITALS — BP 132/84 | Ht 65.5 in | Wt 186.0 lb

## 2017-03-02 DIAGNOSIS — Z01419 Encounter for gynecological examination (general) (routine) without abnormal findings: Secondary | ICD-10-CM

## 2017-03-02 DIAGNOSIS — Z30431 Encounter for routine checking of intrauterine contraceptive device: Secondary | ICD-10-CM | POA: Diagnosis not present

## 2017-03-02 NOTE — Progress Notes (Signed)
Lindsey Chan 11/11/1970 [id number]   History:    47 y.o. 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  Daughters 18 going to [employer] next year and 48 yo at [employer].  RP:  Established patient presenting for annual gyn exam   HPI: On Mirena IUD x 02/2016 for cycle control of menorrhagia and dysmenorrhea.  Has frequent BTB, but no heavy flow.  No pelvic pain.  Normal vaginal secretions.  Urine/BMs wnl.  Breasts wnl.  BMI 30.48.    Past medical history,surgical history, family history and social history were all reviewed and documented in the EPIC chart.  Gynecologic History Patient's last menstrual period was 03/01/2017. Contraception: Mirena IUD Last Pap: 01/2016. Results were: Negative Last mammogram: 02/2016. Results were: Benign Bone Density: Never Colonoscopy: Never  Obstetric History OB History  Gravida Para Term Preterm AB Living  2 2 2     2   SAB TAB Ectopic Multiple Live Births          2    # Outcome Date GA Lbr Len/2nd Weight Sex Delivery Anes PTL Lv  2 Term     F Vag-Spont  N LIV  1 Term     F Vag-Spont  N LIV       ROS: A ROS was performed and pertinent positives and negatives are included in the history.  GENERAL: No fevers or chills. HEENT: No change in vision, no earache, sore throat or sinus congestion. NECK: No pain or stiffness. CARDIOVASCULAR: No chest pain or pressure. No palpitations. PULMONARY: No shortness of breath, cough or wheeze. GASTROINTESTINAL: No abdominal pain, nausea, vomiting or diarrhea, melena or bright red blood per rectum. GENITOURINARY: No urinary frequency, urgency, hesitancy or dysuria. MUSCULOSKELETAL: No joint or muscle pain, no back pain, no recent trauma. DERMATOLOGIC: No rash, no itching, no lesions. ENDOCRINE: No polyuria, polydipsia, no heat or cold intolerance. No recent change in weight. HEMATOLOGICAL: No anemia or easy bruising or bleeding. NEUROLOGIC: No headache, seizures, numbness, tingling or weakness. PSYCHIATRIC: No  depression, no loss of interest in normal activity or change in sleep pattern.     Exam:   BP 132/84   Ht 5' 5.5" (1.664 m)   Wt 186 lb (84.4 kg)   LMP 03/01/2017 Comment: MIRENA  BMI 30.48 kg/m   Body mass index is 30.48 kg/m.  General appearance : Well developed well nourished female. No acute distress HEENT: Eyes: no retinal hemorrhage or exudates,  Neck supple, trachea midline, no carotid bruits, no thyroidmegaly Lungs: Clear to auscultation, no rhonchi or wheezes, or rib retractions  Heart: Regular rate and rhythm, no murmurs or gallops Breast:Examined in sitting and supine position were symmetrical in appearance, no palpable masses or tenderness,  no skin retraction, no nipple inversion, no nipple discharge, no skin discoloration, no axillary or supraclavicular lymphadenopathy Abdomen: no palpable masses or tenderness, no rebound or guarding Extremities: no edema or skin discoloration or tenderness  Pelvic: Vulva: Normal             Vagina: No gross lesions or discharge  Cervix: No gross lesions or discharge.  Strings seen at cervix.  Pap reflex done.  Uterus  AV, normal size, shape and consistency, non-tender and mobile  Adnexa  Without masses or tenderness  Anus: Normal   Assessment/Plan:  47 y.o. female for annual exam   1. Encounter for routine gynecological examination with Papanicolaou smear of cervix Normal gynecologic exam.  Pap reflex done.  Breasts wnl.  Screening mammo scheduled now.  Health labs with Family MD.  Encouraged to increase physical activity. - Pap IG w/ reflex to HPV when ASC-U  2. Encounter for routine checking of intrauterine contraceptive device (IUD) Mirena IUD well tolerated and in good position.  Genia DelMarie-Lyne Deavion Strider MD, 3:49 PM 03/02/2017

## 2017-03-04 ENCOUNTER — Encounter: Payer: Self-pay | Admitting: Obstetrics & Gynecology

## 2017-03-04 NOTE — Patient Instructions (Signed)
1. Encounter for routine gynecological examination with Papanicolaou smear of cervix Normal gynecologic exam.  Pap reflex done.  Breasts wnl.  Screening mammo scheduled now.  Health labs with Family MD.  Encouraged to increase physical activity. - Pap IG w/ reflex to HPV when [facility]  2. Encounter for routine checking of intrauterine contraceptive device (IUD) Mirena IUD well tolerated and in good position.  Lindsey Chan, it was a pleasure meeting you today!  I will inform you of your results as soon as they are available.  Health Maintenance, Female Adopting a healthy lifestyle and getting preventive care can go a long way to promote health and wellness. Talk with your health care provider about what schedule of regular examinations is right for you. This is a good chance for you to check in with your provider about disease prevention and staying healthy. In between checkups, there are plenty of things you can do on your own. Experts have done a lot of research about which lifestyle changes and preventive measures are most likely to keep you healthy. Ask your health care provider for more information. Weight and diet Eat a healthy diet  Be sure to include plenty of vegetables, fruits, low-fat dairy products, and lean protein.  Do not eat a lot of foods high in solid fats, added sugars, or salt.  Get regular exercise. This is one of the most important things you can do for your health. ? Most adults should exercise for at least 150 minutes each week. The exercise should increase your heart rate and make you sweat (moderate-intensity exercise). ? Most adults should also do strengthening exercises at least twice a week. This is in addition to the moderate-intensity exercise.  Maintain a healthy weight  Body mass index (BMI) is a measurement that can be used to identify possible weight problems. It estimates body fat based on height and weight. Your health care provider can help determine your BMI and  help you achieve or maintain a healthy weight.  For females 66 years of age and older: ? A BMI below 18.5 is considered underweight. ? A BMI of 18.5 to 24.9 is normal. ? A BMI of 25 to 29.9 is considered overweight. ? A BMI of 30 and above is considered obese.  Watch levels of cholesterol and blood lipids  You should start having your blood tested for lipids and cholesterol at 48 years of age, then have this test every 5 years.  You may need to have your cholesterol levels checked more often if: ? Your lipid or cholesterol levels are high. ? You are older than 48 years of age. ? You are at high risk for heart disease.  Cancer screening Lung Cancer  Lung cancer screening is recommended for adults 19-26 years old who are at high risk for lung cancer because of a history of smoking.  A yearly low-dose CT scan of the lungs is recommended for people who: ? Currently smoke. ? Have quit within the past 15 years. ? Have at least a 30-pack-year history of smoking. A pack year is smoking an average of one pack of cigarettes a day for 1 year.  Yearly screening should continue until it has been 15 years since you quit.  Yearly screening should stop if you develop a health problem that would prevent you from having lung cancer treatment.  Breast Cancer  Practice breast self-awareness. This means understanding how your breasts normally appear and feel.  It also means doing regular breast self-exams. Let your  health care provider know about any changes, no matter how small.  If you are in your 20s or 30s, you should have a clinical breast exam (CBE) by a health care provider every 1-3 years as part of a regular health exam.  If you are 51 or older, have a CBE every year. Also consider having a breast X-ray (mammogram) every year.  If you have a family history of breast cancer, talk to your health care provider about genetic screening.  If you are at high risk for breast cancer, talk to  your health care provider about having an MRI and a mammogram every year.  Breast cancer gene (BRCA) assessment is recommended for women who have family members with BRCA-related cancers. BRCA-related cancers include: ? Breast. ? Ovarian. ? Tubal. ? Peritoneal cancers.  Results of the assessment will determine the need for genetic counseling and BRCA1 and BRCA2 testing.  Cervical Cancer Your health care provider may recommend that you be screened regularly for cancer of the pelvic organs (ovaries, uterus, and vagina). This screening involves a pelvic examination, including checking for microscopic changes to the surface of your cervix (Pap test). You may be encouraged to have this screening done every 3 years, beginning at age 3.  For women ages 53-65, health care providers may recommend pelvic exams and Pap testing every 3 years, or they may recommend the Pap and pelvic exam, combined with testing for human papilloma virus (HPV), every 5 years. Some types of HPV increase your risk of cervical cancer. Testing for HPV may also be done on women of any age with unclear Pap test results.  Other health care providers may not recommend any screening for nonpregnant women who are considered low risk for pelvic cancer and who do not have symptoms. Ask your health care provider if a screening pelvic exam is right for you.  If you have had past treatment for cervical cancer or a condition that could lead to cancer, you need Pap tests and screening for cancer for at least 20 years after your treatment. If Pap tests have been discontinued, your risk factors (such as having a new sexual partner) need to be reassessed to determine if screening should resume. Some women have medical problems that increase the chance of getting cervical cancer. In these cases, your health care provider may recommend more frequent screening and Pap tests.  Colorectal Cancer  This type of cancer can be detected and often  prevented.  Routine colorectal cancer screening usually begins at 48 years of age and continues through 48 years of age.  Your health care provider may recommend screening at an earlier age if you have risk factors for colon cancer.  Your health care provider may also recommend using home test kits to check for hidden blood in the stool.  A small camera at the end of a tube can be used to examine your colon directly (sigmoidoscopy or colonoscopy). This is done to check for the earliest forms of colorectal cancer.  Routine screening usually begins at age 65.  Direct examination of the colon should be repeated every 5-10 years through 48 years of age. However, you may need to be screened more often if early forms of precancerous polyps or small growths are found.  Skin Cancer  Check your skin from head to toe regularly.  Tell your health care provider about any new moles or changes in moles, especially if there is a change in a mole's shape or color.  Also  tell your health care provider if you have a mole that is larger than the size of a pencil eraser.  Always use sunscreen. Apply sunscreen liberally and repeatedly throughout the day.  Protect yourself by wearing long sleeves, pants, a wide-brimmed hat, and sunglasses whenever you are outside.  Heart disease, diabetes, and high blood pressure  High blood pressure causes heart disease and increases the risk of stroke. High blood pressure is more likely to develop in: ? People who have blood pressure in the high end of the normal range (130-139/85-89 mm Hg). ? People who are overweight or obese. ? People who are African American.  If you are 70-88 years of age, have your blood pressure checked every 3-5 years. If you are 70 years of age or older, have your blood pressure checked every year. You should have your blood pressure measured twice-once when you are at a hospital or clinic, and once when you are not at a hospital or clinic.  Record the average of the two measurements. To check your blood pressure when you are not at a hospital or clinic, you can use: ? An automated blood pressure machine at a pharmacy. ? A home blood pressure monitor.  If you are between 31 years and 20 years old, ask your health care provider if you should take aspirin to prevent strokes.  Have regular diabetes screenings. This involves taking a blood sample to check your fasting blood sugar level. ? If you are at a normal weight and have a low risk for diabetes, have this test once every three years after 48 years of age. ? If you are overweight and have a high risk for diabetes, consider being tested at a younger age or more often. Preventing infection Hepatitis B  If you have a higher risk for hepatitis B, you should be screened for this virus. You are considered at high risk for hepatitis B if: ? You were born in a country where hepatitis B is common. Ask your health care provider which countries are considered high risk. ? Your parents were born in a high-risk country, and you have not been immunized against hepatitis B (hepatitis B vaccine). ? You have HIV or AIDS. ? You use needles to inject street drugs. ? You live with someone who has hepatitis B. ? You have had sex with someone who has hepatitis B. ? You get hemodialysis treatment. ? You take certain medicines for conditions, including cancer, organ transplantation, and autoimmune conditions.  Hepatitis C  Blood testing is recommended for: ? Everyone born from [birth year] through 1966. ? Anyone with known risk factors for hepatitis C.  Sexually transmitted infections (STIs)  You should be screened for sexually transmitted infections (STIs) including gonorrhea and chlamydia if: ? You are sexually active and are younger than 48 years of age. ? You are older than 48 years of age and your health care provider tells you that you are at risk for this type of infection. ? Your sexual  activity has changed since you were last screened and you are at an increased risk for chlamydia or gonorrhea. Ask your health care provider if you are at risk.  If you do not have HIV, but are at risk, it may be recommended that you take a prescription medicine daily to prevent HIV infection. This is called pre-exposure prophylaxis (PrEP). You are considered at risk if: ? You are sexually active and do not regularly use condoms or know the HIV status of your  partner(s). ? You take drugs by injection. ? You are sexually active with a partner who has HIV.  Talk with your health care provider about whether you are at high risk of being infected with HIV. If you choose to begin PrEP, you should first be tested for HIV. You should then be tested every 3 months for as long as you are taking PrEP. Pregnancy  If you are premenopausal and you may become pregnant, ask your health care provider about preconception counseling.  If you may become pregnant, take 400 to 800 micrograms (mcg) of folic acid every day.  If you want to prevent pregnancy, talk to your health care provider about birth control (contraception). Osteoporosis and menopause  Osteoporosis is a disease in which the bones lose minerals and strength with aging. This can result in serious bone fractures. Your risk for osteoporosis can be identified using a bone density scan.  If you are 66 years of age or older, or if you are at risk for osteoporosis and fractures, ask your health care provider if you should be screened.  Ask your health care provider whether you should take a calcium or vitamin D supplement to lower your risk for osteoporosis.  Menopause may have certain physical symptoms and risks.  Hormone replacement therapy may reduce some of these symptoms and risks. Talk to your health care provider about whether hormone replacement therapy is right for you. Follow these instructions at home:  Schedule regular health, dental,  and eye exams.  Stay current with your immunizations.  Do not use any tobacco products including cigarettes, chewing tobacco, or electronic cigarettes.  If you are pregnant, do not drink alcohol.  If you are breastfeeding, limit how much and how often you drink alcohol.  Limit alcohol intake to no more than 1 drink per day for nonpregnant women. One drink equals 12 ounces of beer, 5 ounces of wine, or 1 ounces of hard liquor.  Do not use street drugs.  Do not share needles.  Ask your health care provider for help if you need support or information about quitting drugs.  Tell your health care provider if you often feel depressed.  Tell your health care provider if you have ever been abused or do not feel safe at home. This information is not intended to replace advice given to you by your health care provider. Make sure you discuss any questions you have with your health care provider. Document Released: 07/07/2010 Document Revised: 05/30/2015 Document Reviewed: 09/25/2014 Elsevier Interactive Patient Education  [employer].

## 2017-03-05 LAB — PAP IG W/ RFLX HPV ASCU

## 2017-03-08 ENCOUNTER — Encounter: Payer: Self-pay | Admitting: *Deleted

## 2017-03-19 ENCOUNTER — Emergency Department (HOSPITAL_COMMUNITY): Payer: BLUE CROSS/BLUE SHIELD

## 2017-03-19 ENCOUNTER — Encounter (HOSPITAL_COMMUNITY): Payer: Self-pay | Admitting: Emergency Medicine

## 2017-03-19 ENCOUNTER — Other Ambulatory Visit: Payer: Self-pay

## 2017-03-19 ENCOUNTER — Emergency Department (HOSPITAL_COMMUNITY)
Admission: EM | Admit: 2017-03-19 | Discharge: 2017-03-19 | Disposition: A | Discharge status: Home / Self Care | Payer: BLUE CROSS/BLUE SHIELD | Attending: Emergency Medicine | Admitting: Emergency Medicine

## 2017-03-19 DIAGNOSIS — R55 Syncope and collapse: Secondary | ICD-10-CM

## 2017-03-19 DIAGNOSIS — R197 Diarrhea, unspecified: Secondary | ICD-10-CM

## 2017-03-19 DIAGNOSIS — J45909 Unspecified asthma, uncomplicated: Secondary | ICD-10-CM | POA: Insufficient documentation

## 2017-03-19 DIAGNOSIS — R112 Nausea with vomiting, unspecified: Secondary | ICD-10-CM | POA: Diagnosis present

## 2017-03-19 DIAGNOSIS — Z79899 Other long term (current) drug therapy: Secondary | ICD-10-CM | POA: Diagnosis not present

## 2017-03-19 DIAGNOSIS — K529 Noninfective gastroenteritis and colitis, unspecified: Secondary | ICD-10-CM

## 2017-03-19 LAB — COMPREHENSIVE METABOLIC PANEL
ALK PHOS: 56 U/L (ref 38–126)
ALT: 18 U/L (ref 14–54)
ANION GAP: 13 (ref 5–15)
AST: 24 U/L (ref 15–41)
Albumin: 4.5 g/dL (ref 3.5–5.0)
BUN: 18 mg/dL (ref 6–20)
CALCIUM: 9.5 mg/dL (ref 8.9–10.3)
CO2: 25 mmol/L (ref 22–32)
Chloride: 104 mmol/L (ref 101–111)
Creatinine, Ser: 0.89 mg/dL (ref 0.44–1.00)
GFR calc Af Amer: >60 mL/min (ref >60)
GFR calc non Af Amer: >60 mL/min (ref >60)
GLUCOSE: 119 mg/dL — AB (ref 65–99)
Potassium: 3.4 mmol/L — ABNORMAL LOW (ref 3.5–5.1)
SODIUM: 142 mmol/L (ref 135–145)
Total Bilirubin: 0.8 mg/dL (ref 0.3–1.2)
Total Protein: 7.7 g/dL (ref 6.5–8.1)

## 2017-03-19 LAB — CBC
HCT: 47.1 % — ABNORMAL HIGH (ref 36.0–46.0)
HEMOGLOBIN: 16.2 g/dL — AB (ref 12.0–15.0)
MCH: 31.5 pg (ref 26.0–34.0)
MCHC: 34.4 g/dL (ref 30.0–36.0)
MCV: 91.6 fL (ref 78.0–100.0)
Platelets: 229 10*3/uL (ref 150–400)
RBC: 5.14 MIL/uL — ABNORMAL HIGH (ref 3.87–5.11)
RDW: 13.4 % (ref 11.5–15.5)
WBC: 23.7 10*3/uL — ABNORMAL HIGH (ref 4.0–10.5)

## 2017-03-19 LAB — I-STAT CG4 LACTIC ACID, ED
LACTIC ACID, VENOUS: 2.95 mmol/L — AB (ref 0.5–1.9)
LACTIC ACID, VENOUS: 2.55 mmol/L — AB (ref 0.5–1.9)

## 2017-03-19 LAB — URINALYSIS, ROUTINE W REFLEX MICROSCOPIC
BILIRUBIN URINE: NEGATIVE
GLUCOSE, UA: NEGATIVE mg/dL
HGB URINE DIPSTICK: NEGATIVE
Ketones, ur: 20 mg/dL — AB
Leukocytes, UA: NEGATIVE
Nitrite: NEGATIVE
Protein, ur: NEGATIVE mg/dL
SPECIFIC GRAVITY, URINE: 1.008 (ref 1.005–1.030)
pH: 7 (ref 5.0–8.0)

## 2017-03-19 LAB — I-STAT BETA HCG BLOOD, ED (MC, WL, AP ONLY)

## 2017-03-19 LAB — LIPASE, BLOOD: Lipase: 31 U/L (ref 11–51)

## 2017-03-19 LAB — CBG MONITORING, ED: Glucose-Capillary: 131 mg/dL — ABNORMAL HIGH (ref 65–99)

## 2017-03-19 MED ORDER — METRONIDAZOLE 500 MG PO TABS: 500.0000 mg | ORAL_TABLET | Freq: Three times a day (TID) | ORAL | 0 refills | Status: DC

## 2017-03-19 MED ORDER — FENTANYL CITRATE (PF) 100 MCG/2ML IJ SOLN
50.0000 ug | Freq: Once | INTRAMUSCULAR | Status: AC
  Administered 2017-03-19: 50 ug via INTRAVENOUS
  Filled 2017-03-19: qty 2

## 2017-03-19 MED ORDER — CIPROFLOXACIN HCL 500 MG PO TABS: 500.0000 mg | ORAL_TABLET | Freq: Two times a day (BID) | ORAL | 0 refills | Status: DC

## 2017-03-19 MED ORDER — CIPROFLOXACIN HCL 500 MG PO TABS
500.0000 mg | ORAL_TABLET | Freq: Once | ORAL | Status: AC
  Administered 2017-03-19: 500 mg via ORAL
  Filled 2017-03-19: qty 1

## 2017-03-19 MED ORDER — IOPAMIDOL (ISOVUE-300) INJECTION 61%
100.0000 mL | Freq: Once | INTRAVENOUS | Status: AC | PRN
  Administered 2017-03-19: 100 mL via INTRAVENOUS

## 2017-03-19 MED ORDER — DICYCLOMINE HCL 20 MG PO TABS: 20.0000 mg | ORAL_TABLET | Freq: Three times a day (TID) | ORAL | 0 refills | Status: DC | PRN

## 2017-03-19 MED ORDER — ONDANSETRON HCL 4 MG/2ML IJ SOLN
4.0000 mg | Freq: Once | INTRAMUSCULAR | Status: AC
  Administered 2017-03-19: 4 mg via INTRAVENOUS
  Filled 2017-03-19: qty 2

## 2017-03-19 MED ORDER — IOPAMIDOL (ISOVUE-300) INJECTION 61%
INTRAVENOUS | Status: AC
  Filled 2017-03-19: qty 100

## 2017-03-19 MED ORDER — DICYCLOMINE HCL 10 MG/ML IM SOLN
20.0000 mg | Freq: Once | INTRAMUSCULAR | Status: AC
  Administered 2017-03-19: 20 mg via INTRAMUSCULAR
  Filled 2017-03-19: qty 2

## 2017-03-19 MED ORDER — SODIUM CHLORIDE 0.9 % IV BOLUS (SEPSIS)
1000.0000 mL | Freq: Once | INTRAVENOUS | Status: AC
  Administered 2017-03-19: 1000 mL via INTRAVENOUS

## 2017-03-19 MED ORDER — METRONIDAZOLE 500 MG PO TABS
500.0000 mg | ORAL_TABLET | Freq: Once | ORAL | Status: AC
  Administered 2017-03-19: 500 mg via ORAL
  Filled 2017-03-19: qty 1

## 2017-03-19 MED ORDER — ONDANSETRON 4 MG PO TBDP: 4.0000 mg | ORAL_TABLET | Freq: Four times a day (QID) | ORAL | 0 refills | Status: DC | PRN

## 2017-03-19 MED ORDER — LOPERAMIDE HCL 2 MG PO CAPS
4.0000 mg | ORAL_CAPSULE | Freq: Once | ORAL | Status: AC
  Administered 2017-03-19: 4 mg via ORAL
  Filled 2017-03-19: qty 2

## 2017-03-19 NOTE — ED Notes (Signed)
I gave critical I stat CG4 result to MD Ward 

## 2017-03-19 NOTE — ED Provider Notes (Addendum)
TIME SEEN: 2:34 AM  CHIEF COMPLAINT: Nausea, vomiting, diarrhea  HPI: Patient is a 47 year old female with history of asthma, fibromyalgia who presents to the emergency department with complaints of nausea, vomiting and diarrhea that started at 10 PM tonight.  Reports she had several episodes of both vomiting and diarrhea and then while on the toilet had a syncopal event.  Did not fall to the ground.  No chest pain or shortness of breath with this episode.  Complaining of cramping abdominal pain mostly in the lower abdomen.  No dysuria, hematuria, vaginal bleeding or discharge.  Has had previous left oophorectomy.  ROS: See HPI Constitutional: no fever  Eyes: no drainage  ENT: no runny nose   Cardiovascular:  no chest pain  Resp: no SOB  GI: Diarrhea and vomiting GU: no dysuria Integumentary: no rash  Allergy: no hives  Musculoskeletal: no leg swelling  Neurological: no slurred speech ROS otherwise negative  PAST MEDICAL HISTORY/PAST SURGICAL HISTORY:  Past Medical History:  Diagnosis Date  . Asthma   . Fibromyalgia     MEDICATIONS:  Prior to Admission medications   Medication Sig Start Date End Date Taking? Authorizing Provider  levonorgestrel (MIRENA) 20 MCG/24HR IUD 1 each by Intrauterine route once.    [provider]  loratadine (CLARITIN) 10 MG tablet Take 10 mg by mouth daily.    [provider]  PROVENTIL HFA 108 (90 Base) MCG/ACT inhaler INHALE TWO PUFFS EVERY 6 HOURS AS NEEDED 11/02/15   [provider]    ALLERGIES:  Allergies  Allergen Reactions  . Clarithromycin Shortness Of Breath  . Naproxen Shortness Of Breath    Takes ibuprofen at home  . Clindamycin/Lincomycin Hives  . Levaquin [Levofloxacin In D5w]     Joint pain  . Amoxicillin Rash    Has patient had a PCN reaction causing immediate rash, facial/tongue/throat swelling, SOB or lightheadedness with hypotension: Yes Has patient had a PCN reaction causing severe rash involving  mucus membranes or skin necrosis: No Has patient had a PCN reaction that required hospitalization No Has patient had a PCN reaction occurring within the last 10 years: No If all of the above answers are "NO", then may proceed with Cephalosporin use.     SOCIAL HISTORY:  Social History   Tobacco Use  . Smoking status: Never Smoker  . Smokeless tobacco: Never Used  Substance Use Topics  . Alcohol use: No    Alcohol/week: 0.0 oz    FAMILY HISTORY: Family History  Problem Relation Age of Onset  . Hypertension Mother     EXAM: BP 100/71 (BP Location: Left Arm)   Pulse 60   Temp (!) 97.4 F (36.3 C) (Oral)   Resp 13   LMP 03/01/2017 Comment: MIRENA  SpO2 97%  CONSTITUTIONAL: Alert and oriented and responds appropriately to questions. Well-appearing; well-nourished HEAD: Normocephalic EYES: Conjunctivae clear, pupils appear equal, EOMI ENT: normal nose; moist mucous membranes NECK: Supple, no meningismus, no nuchal rigidity, no LAD  CARD: RRR; S1 and S2 appreciated; no murmurs, no clicks, no rubs, no gallops RESP: Normal chest excursion without splinting or tachypnea; breath sounds clear and equal bilaterally; no wheezes, no rhonchi, no rales, no hypoxia or respiratory distress, speaking full sentences ABD/GI: Normal bowel sounds; non-distended; soft, tender throughout the abdomen, no rebound, no guarding, no peritoneal signs, no hepatosplenomegaly BACK:  The back appears normal and is non-tender to palpation, there is no CVA tenderness EXT: Normal ROM in all joints; non-tender to palpation; no edema;  normal capillary refill; no cyanosis, no calf tenderness or swelling    SKIN: Normal color for age and race; warm; no rash NEURO: Moves all extremities equally PSYCH: The patient's mood and manner are appropriate. Grooming and personal hygiene are appropriate.  MEDICAL DECISION MAKING: Patient here with abdominal pain, vomiting, diarrhea.  Suspect viral gastroenteritis.   Differential does include colitis, appendicitis, diverticulitis.  Doubt bowel obstruction.  Will obtain labs, urine.  Will treat symptomatically with IV fluids, Zofran, Bentyl, Imodium.  Will obtain EKG.  Blood sugar with EMS was normal.  No chest pain or shortness of breath with syncopal event.  Suspect dehydration.  ED PROGRESS: Patient's white blood cell count has come back significantly elevated at 23,000.  Given slightly low blood pressure and leukocytosis will obtain lactate and CT of her abdomen pelvis.  If no other infectious symptoms.  Will continue to hydrate patient aggressively.  Patient reports feeling better.  Blood pressure has significantly improved.  CT scan shows possible colitis.  Suspect this is infectious in nature.  Will start her on Cipro and Flagyl.  EKG shows no significant abnormality.  Patient has been able to drink without difficulty.  She agrees with plan for discharge home.  Will repeat lactate and give dose of fentanyl for further pain control.    Patient's repeat lactate is improving.  I feel this is secondary to dehydration rather than sepsis.  She would still like discharge home and I feel this is reasonable.  Will discharge with Cipro, Flagyl, Zofran, Bentyl.  Discussed return precautions.  Recommended bland diet.  Patient and family comfortable with this plan.   Also discussed mild nonspecific prominence of the cervix which is seen on CT scan.  States she had a Pap smear 3 weeks ago which was normal.  At this time, I do not feel there is any life-threatening condition present. I have reviewed and discussed all results (EKG, imaging, lab, urine as appropriate) and exam findings with patient/family. I have reviewed nursing notes and appropriate previous records.  I feel the patient is safe to be discharged home without further emergent workup and can continue workup as an outpatient as needed. Discussed usual and customary return precautions. Patient/family verbalize  understanding and are comfortable with this plan.  Outpatient follow-up has been provided if needed. All questions have been answered.   EKG Interpretation  Date/Time:  Friday March 19 2017 02:51:49 EDT Ventricular Rate:  56 PR Interval:    QRS Duration: 116 QT Interval:  470 QTC Calculation: 454 R Axis:   10 Text Interpretation:  Sinus rhythm Borderline short PR interval Nonspecific intraventricular conduction delay Probable anteroseptal infarct, old No old tracing to compare Confirmed by Lempi Edwin, Baxter HireKristen [phone]([id number]) on 03/19/2017 6:18:05 AM         Margrit Minner, Layla MawKristen N, DO 03/19/17 [id number]    Natash Berman, Layla MawKristen N, DO 03/19/17 [id number]

## 2017-03-19 NOTE — ED Triage Notes (Signed)
Pt brought in by EMS for c/o severe lower abd pain that started about an hour ago  Pt had some vomiting  Pt was sitting on the toilet and had a syncopal episode  Pt did not fall, family lowered her to the floor   EMS reports 12 lead is unremarkable  IV was started and Fentanyl was given

## 2017-03-19 NOTE — ED Notes (Signed)
I gave I stat CG4 critical results to MD Ward

## 2017-03-19 NOTE — Discharge Instructions (Signed)
You may alternate Tylenol 1000 mg every 6 hours as needed for pain and Ibuprofen 800 mg every 8 hours as needed for pain.  Please take Ibuprofen with food.   Please make sure you are drinking between 60-80 ounces of water a day.

## 2018-03-02 ENCOUNTER — Encounter: Payer: Self-pay | Admitting: Obstetrics & Gynecology

## 2018-03-08 ENCOUNTER — Encounter: Payer: Self-pay | Admitting: Obstetrics & Gynecology

## 2018-03-08 ENCOUNTER — Ambulatory Visit (INDEPENDENT_AMBULATORY_CARE_PROVIDER_SITE_OTHER): Payer: BLUE CROSS/BLUE SHIELD | Admitting: Obstetrics & Gynecology

## 2018-03-08 VITALS — BP 120/80 | Ht 65.0 in | Wt 172.0 lb

## 2018-03-08 DIAGNOSIS — Z01419 Encounter for gynecological examination (general) (routine) without abnormal findings: Secondary | ICD-10-CM

## 2018-03-08 DIAGNOSIS — Z30431 Encounter for routine checking of intrauterine contraceptive device: Secondary | ICD-10-CM

## 2018-03-08 DIAGNOSIS — N941 Unspecified dyspareunia: Secondary | ICD-10-CM | POA: Diagnosis not present

## 2018-03-08 NOTE — Progress Notes (Signed)
Lindsey Chan 10-Oct-1970 [id number]   History:    48 y.o. 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  Daughters 19 going to [employer] [place] and 49 yo at [employer] [place].  RP:  Established patient presenting for annual gyn exam   HPI: On Mirena IUD x 02/2016 for cycle control of menorrhagia and dysmenorrhea.  Has frequent BTB, but no heavy flow.  No pelvic pain.  Normal vaginal secretions.  Urine/BMs wnl.  Breasts wnl.  BMI  28.62.  Health labs with family physician.  Past medical history,surgical history, family history and social history were all reviewed and documented in the EPIC chart.  Gynecologic History Patient's last menstrual period was 02/22/2018. Contraception: Mirena IUD x 02/2016 Last Pap: 02/2017. Results were: Negative Last mammogram: 2019. Results were: normal Bone Density: Never Colonoscopy: Never  Obstetric History OB History  Gravida Para Term Preterm AB Living  2 2 2     2   SAB TAB Ectopic Multiple Live Births          2    # Outcome Date GA Lbr Len/2nd Weight Sex Delivery Anes PTL Lv  2 Term     F Vag-Spont  N LIV  1 Term     F Vag-Spont  N LIV     ROS: A ROS was performed and pertinent positives and negatives are included in the history.  GENERAL: No fevers or chills. HEENT: No change in vision, no earache, sore throat or sinus congestion. NECK: No pain or stiffness. CARDIOVASCULAR: No chest pain or pressure. No palpitations. PULMONARY: No shortness of breath, cough or wheeze. GASTROINTESTINAL: No abdominal pain, nausea, vomiting or diarrhea, melena or bright red blood per rectum. GENITOURINARY: No urinary frequency, urgency, hesitancy or dysuria. MUSCULOSKELETAL: No joint or muscle pain, no back pain, no recent trauma. DERMATOLOGIC: No rash, no itching, no lesions. ENDOCRINE: No polyuria, polydipsia, no heat or cold intolerance. No recent change in weight. HEMATOLOGICAL: No anemia or easy bruising or bleeding. NEUROLOGIC: No headache, seizures, numbness, tingling or  weakness. PSYCHIATRIC: No depression, no loss of interest in normal activity or change in sleep pattern.     Exam:   Ht 5\' 5"  (1.651 m)   Wt 172 lb (78 kg)   LMP 02/22/2018 Comment: mirena   BMI 28.62 kg/m   Body mass index is 28.62 kg/m.  General appearance : Well developed well nourished female. No acute distress HEENT: Eyes: no retinal hemorrhage or exudates,  Neck supple, trachea midline, no carotid bruits, no thyroidmegaly Lungs: Clear to auscultation, no rhonchi or wheezes, or rib retractions  Heart: Regular rate and rhythm, no murmurs or gallops Breast:Examined in sitting and supine position were symmetrical in appearance, no palpable masses or tenderness,  no skin retraction, no nipple inversion, no nipple discharge, no skin discoloration, no axillary or supraclavicular lymphadenopathy Abdomen: no palpable masses or tenderness, no rebound or guarding Extremities: no edema or skin discoloration or tenderness  Pelvic: Vulva: Normal             Vagina: No gross lesions or discharge  Cervix: No gross lesions or discharge.  IUD strings short, felt on bimanual exam.   Uterus  AV, normal size, shape and consistency, non-tender and mobile  Adnexa  Without masses or tenderness  Anus: Normal   Assessment/Plan:  48 y.o. female for annual exam   1. Well female exam with routine gynecological exam Normal gynecologic exam.  Pap test in February 2019 was negative, no indication to repeat this year.  Breast exam normal.  Screening mammogram done in 2019, will obtain report.  Health labs with family physician.  2. Encounter for routine checking of intrauterine contraceptive device (IUD) Mirena IUD well-tolerated and in good position, except for cramps on the left side after intercourse which will be investigated with a pelvic ultrasound at follow-up.  3. Dyspareunia in female Complains of occasional left pelvic cramps after intercourse.  Follow-up for a pelvic ultrasound to confirm  good location of the IUD and rule out other pathology. - US Transvaginal Non-OB; Future  Other orders - Thyroid (NATURE-THROID) 97.5 MG TABS; Take by mouth. - Testosterone 20 % CREA; by Does not apply route.  Counseling on above issues and coordination of care more than 50% for 10 minutes.  Genia Del MD, 4:06 PM 03/08/2018

## 2018-03-09 ENCOUNTER — Encounter: Payer: Self-pay | Admitting: Obstetrics & Gynecology

## 2018-03-09 NOTE — Patient Instructions (Signed)
1. Well female exam with routine gynecological exam Normal gynecologic exam.  Pap test in February 2019 was negative, no indication to repeat this year.  Breast exam normal.  Screening mammogram done in 2019, will obtain report.  Health labs with family physician.  2. Encounter for routine checking of intrauterine contraceptive device (IUD) Mirena IUD well-tolerated and in good position, except for cramps on the left side after intercourse which will be investigated with a pelvic ultrasound at follow-up.  3. Dyspareunia in female Complains of occasional left pelvic cramps after intercourse.  Follow-up for a pelvic ultrasound to confirm good location of the IUD and rule out other pathology. - US Transvaginal Non-OB; Future  Other orders - Thyroid (NATURE-THROID) 97.5 MG TABS; Take by mouth. - Testosterone 20 % CREA; by Does not apply route.  Lindsey Chan, it was a pleasure seeing you today!

## 2018-03-29 IMAGING — CT CT ABD-PELV W/ CM
2 of 5 series · 15 of 46 positions shown, 17 images · IV contrast (ISOVUE)
Comparison: Pelvic ultrasound performed 12/20/2014

CLINICAL DATA: Acute onset of severe lower abdominal pain.
Leukocytosis, nausea and vomiting. Elevated lactic acid.

EXAM:
CT ABDOMEN AND PELVIS WITH CONTRAST
TECHNIQUE: Multidetector CT imaging of the abdomen and pelvis was performed
using the standard protocol following bolus administration of
intravenous contrast.
CONTRAST:  100mL MR1B0P-JEE IOPAMIDOL (MR1B0P-JEE) INJECTION 61%

[Series 2: axial st · axial · 0.71mm/px · z∈[-22,+388]mm · 12 of 94 slices shown, 14 images]
[im 6/94  soft-tissue]
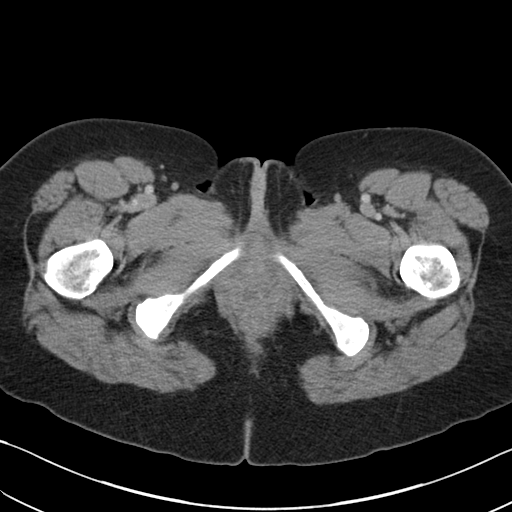
[im 6/94  bone]
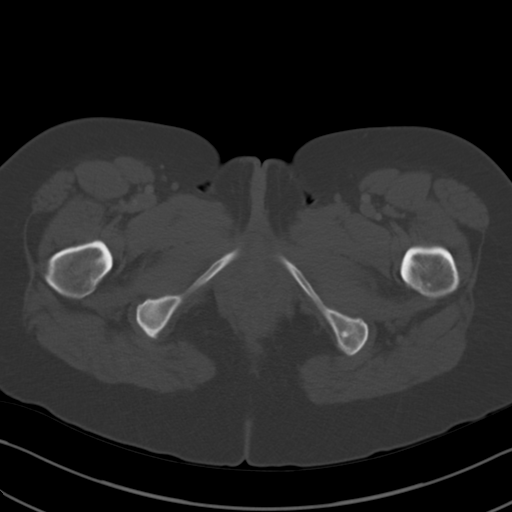
[im 17/94  soft-tissue]
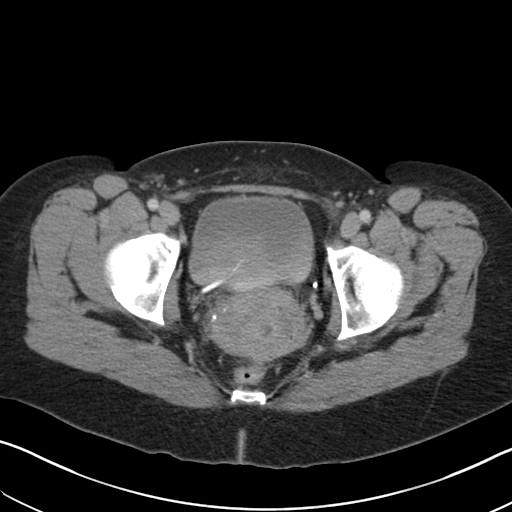
[im 22/94  soft-tissue]
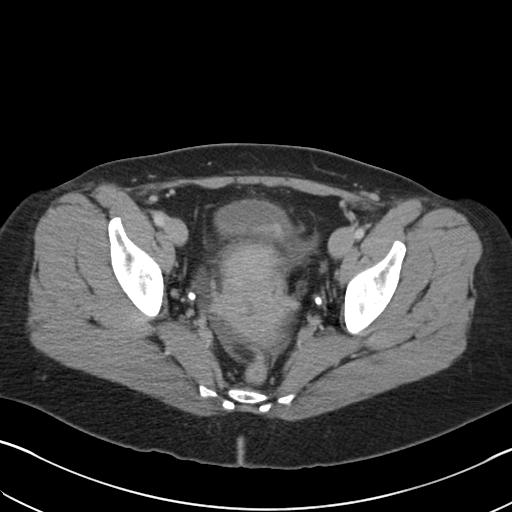
[im 28/94  soft-tissue]
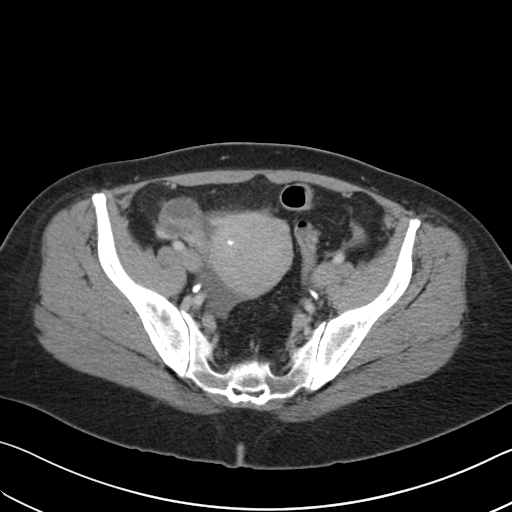
[im 39/94  soft-tissue]
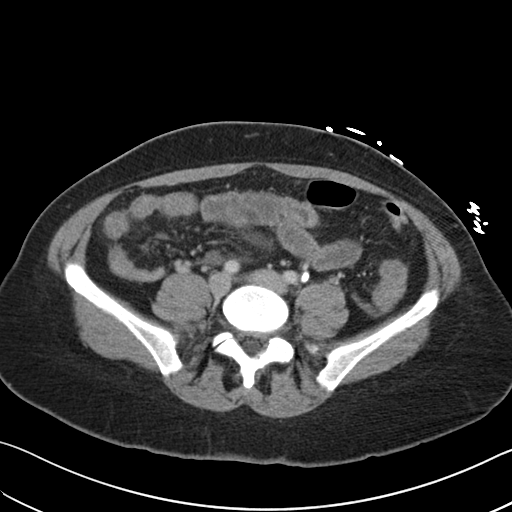
[im 44/94  soft-tissue]
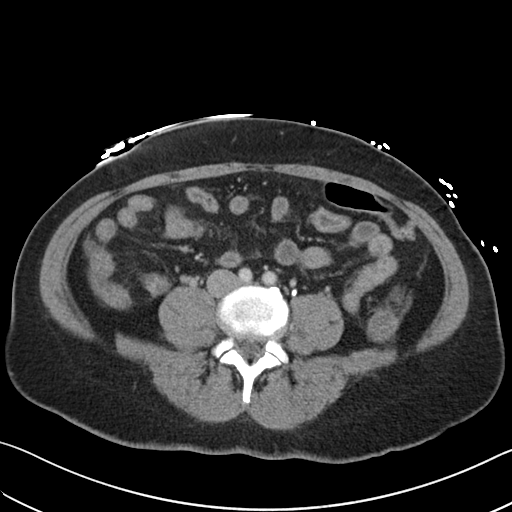
[im 50/94  soft-tissue]
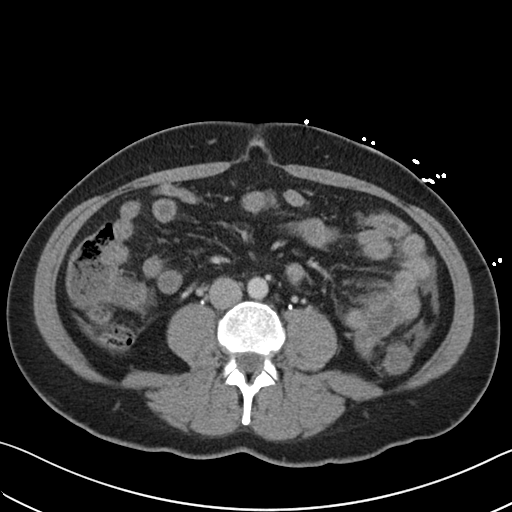
[im 61/94  soft-tissue]
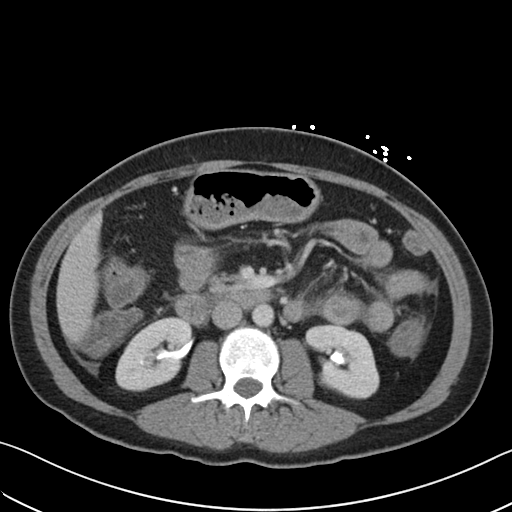
[im 66/94  soft-tissue]
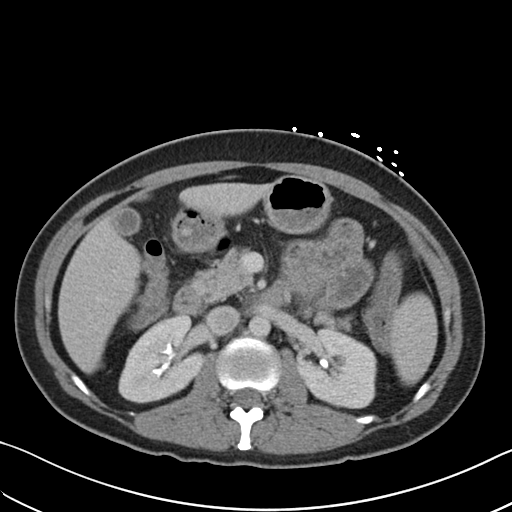
[im 66/94  bone]
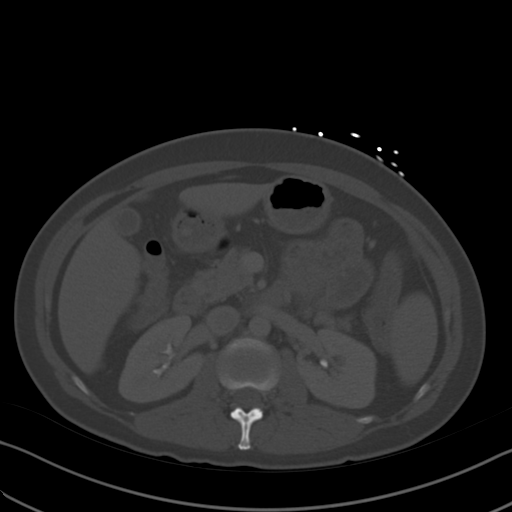
[im 72/94  soft-tissue]
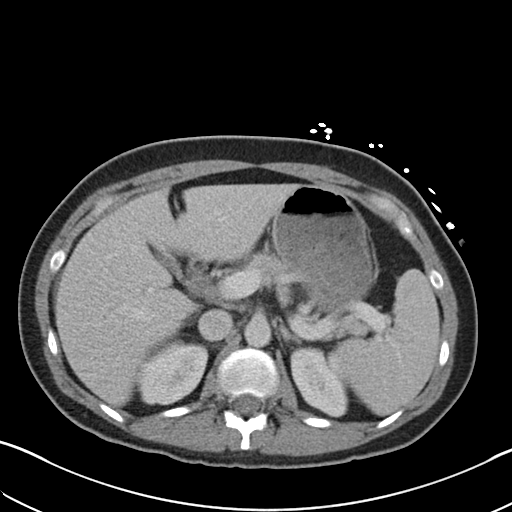
[im 83/94  soft-tissue]
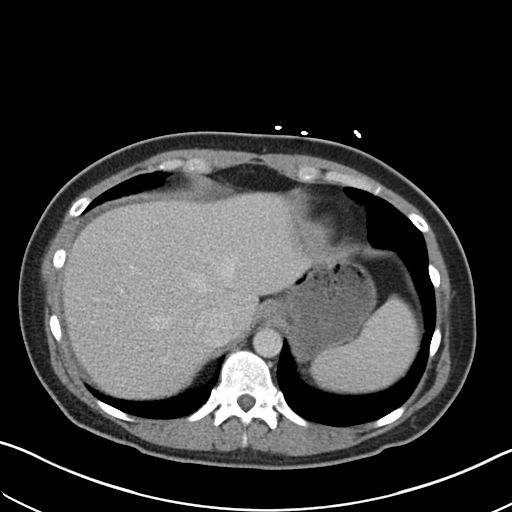
[im 88/94  soft-tissue]
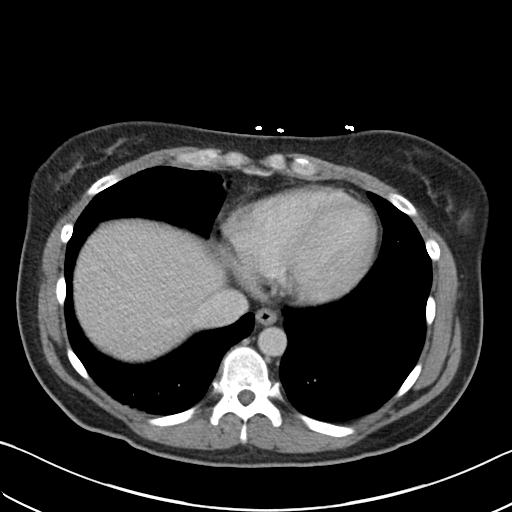

[Series 5: coronal st · coronal · 0.64mm/px · 3 of 77 slices shown]
[im 26/77  soft-tissue]
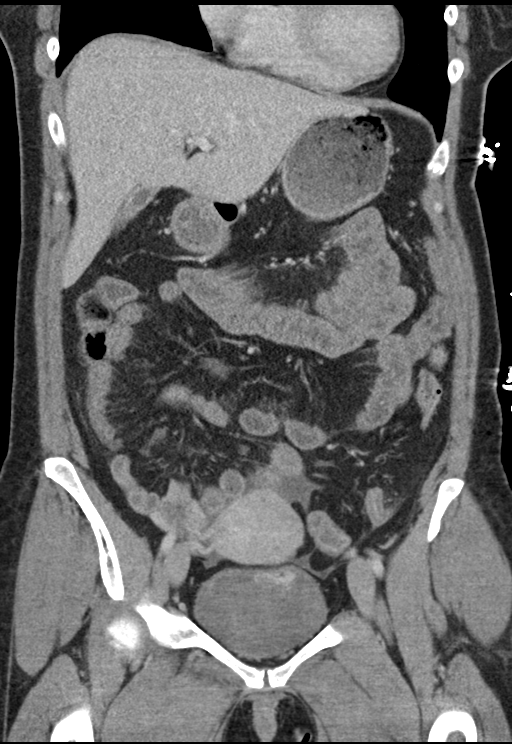
[im 34/77  soft-tissue]
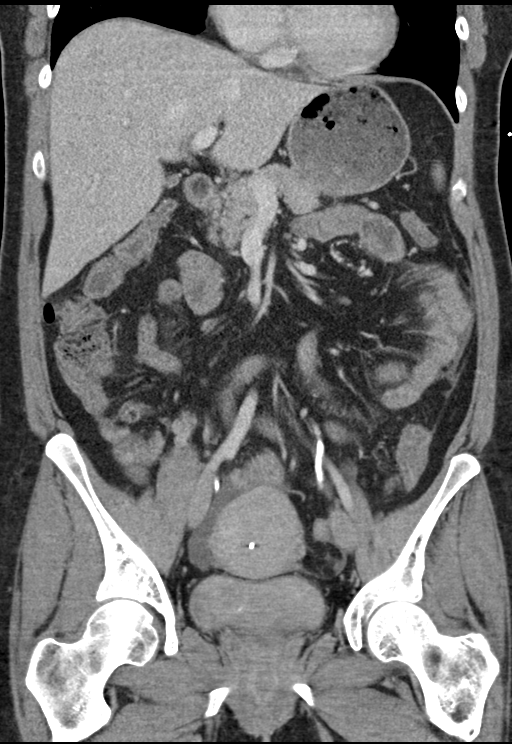
[im 43/77  soft-tissue]
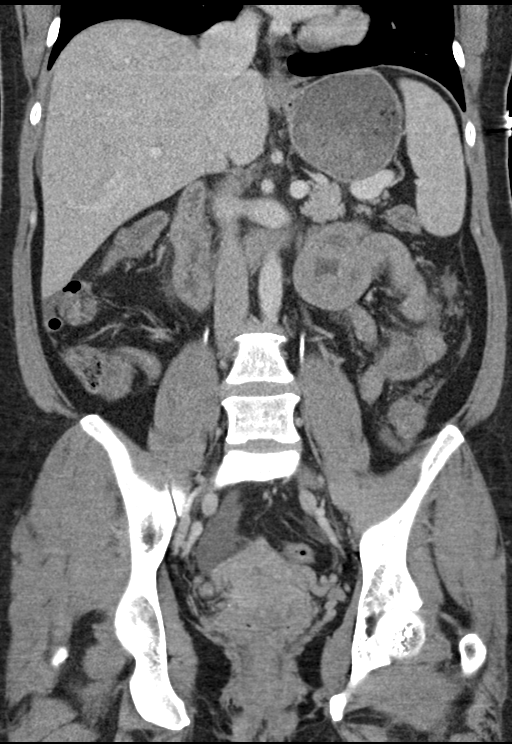

[15 of 46 positions shown; findings below may reference images not displayed]

FINDINGS: Lower chest: The visualized lung bases are grossly clear. The
visualized portions of the mediastinum are unremarkable.

Hepatobiliary: The liver is unremarkable in appearance. The
gallbladder is unremarkable in appearance. The common bile duct
remains normal in caliber. Trace ascites is noted about the liver.

Pancreas: The pancreas is within normal limits.

Spleen: The spleen is unremarkable in appearance.

Adrenals/Urinary Tract: The adrenal glands are unremarkable in
appearance. The kidneys are within normal limits. There is no
evidence of hydronephrosis. No renal or ureteral stones are
identified, though evaluation for stones is limited given contrast
in the renal calyces and ureters bilaterally. No perinephric
stranding is seen.

Stomach/Bowel: Fluid tracks along the paracolic gutters. There is
question of mild wall thickening along the distal transverse and
descending colon, concerning for an acute infectious or inflammatory
process.

The small bowel is unremarkable in appearance. The stomach is
grossly unremarkable. The appendix is not visualized; there is no
evidence for appendicitis.

Vascular/Lymphatic: The abdominal aorta is unremarkable in
appearance. The inferior vena cava is grossly unremarkable. No
retroperitoneal lymphadenopathy is seen. No pelvic sidewall
lymphadenopathy is identified.

Reproductive: The bladder is mildly distended and grossly
unremarkable. The uterus is unremarkable in appearance. The
intrauterine device is noted in expected position at the fundus of
the uterus. A small amount of free fluid is noted within the pelvis.
There is mild nonspecific prominence of the cervix. No suspicious
adnexal masses are seen.

Other: No additional soft tissue abnormalities are seen.

Musculoskeletal: No acute osseous abnormalities are identified. The
visualized musculature is unremarkable in appearance.
IMPRESSION: 1. Question of mild wall thickening along the distal transverse and
descending colon, concerning for an acute infectious or inflammatory
process. This may explain the trace ascites within the abdomen and
pelvis.
2. Mild nonspecific prominence of the cervix. This may remain within
normal limits. Would correlate with Pap smear, to help exclude
underlying mass.

## 2018-04-12 ENCOUNTER — Other Ambulatory Visit: Payer: Self-pay

## 2018-04-13 ENCOUNTER — Other Ambulatory Visit: Payer: BLUE CROSS/BLUE SHIELD

## 2018-04-13 ENCOUNTER — Ambulatory Visit: Payer: BLUE CROSS/BLUE SHIELD | Admitting: Obstetrics & Gynecology

## 2018-04-14 ENCOUNTER — Ambulatory Visit (INDEPENDENT_AMBULATORY_CARE_PROVIDER_SITE_OTHER): Payer: BLUE CROSS/BLUE SHIELD | Admitting: Obstetrics & Gynecology

## 2018-04-14 ENCOUNTER — Encounter: Payer: Self-pay | Admitting: Obstetrics & Gynecology

## 2018-04-14 ENCOUNTER — Other Ambulatory Visit: Payer: Self-pay

## 2018-04-14 ENCOUNTER — Ambulatory Visit (INDEPENDENT_AMBULATORY_CARE_PROVIDER_SITE_OTHER): Payer: BLUE CROSS/BLUE SHIELD

## 2018-04-14 DIAGNOSIS — N941 Unspecified dyspareunia: Secondary | ICD-10-CM

## 2018-04-14 DIAGNOSIS — R102 Pelvic and perineal pain: Secondary | ICD-10-CM | POA: Diagnosis not present

## 2018-04-14 NOTE — Patient Instructions (Signed)
1. Pelvic pain in female Left pelvic cramping after intercourse.  Pelvic ultrasound uterus completely normal with a thin endometrium at 4.1 mm.  Mirena IUD, since February 2018, is in good intrauterine position by ultrasound today.  A functional 2.5 cm simple cyst is present on the right ovary.  Left ovary has been surgically removed.  Patient reassured.  Will observe.  Lindsey Chan, it was a pleasure seeing you today!

## 2018-04-14 NOTE — Progress Notes (Signed)
    Deveda Tenbrink Aug 29, 1970 [id number]        48 y.o.  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  Fourth grade teacher.  RP: Left pelvic pain for Pelvic US  HPI: Left pelvic pain after IC.  No pelvic pain otherwise.  Well on Mirena IUD since February 2018.  Light menstrual periods every month.  No breakthrough bleeding.  No abnormal vaginal discharge.  Urine and bowel movements normal.  No fever.   OB History  Gravida Para Term Preterm AB Living  2 2 2     2   SAB TAB Ectopic Multiple Live Births          2    # Outcome Date GA Lbr Len/2nd Weight Sex Delivery Anes PTL Lv  2 Term     F Vag-Spont  N LIV  1 Term     F Vag-Spont  N LIV    Past medical history,surgical history, problem list, medications, allergies, family history and social history were all reviewed and documented in the EPIC chart.   Directed ROS with pertinent positives and negatives documented in the history of present illness/assessment and plan.  Exam:  There were no vitals filed for this visit. General appearance:  Normal  Pelvic US today: T/V images.  Anteverted uterus measuring 9.28 x 5.49 x 5.22 cm, normal in size and shape with no mass.  Endometrial lining normal and its in measured at 4.1 mm with no mass or thickening seen.  IUD seen in proper intrauterine position.  Right ovary with a 2.5 cm simple cystic structure, nontender.  Left ovary surgically removed.  No adnexal mass bilaterally.  No free fluid in the posterior to the sac.   Assessment/Plan:  48 y.o. G2P2002   1. Pelvic pain in female Left pelvic cramping after intercourse.  Pelvic ultrasound uterus completely normal with a thin endometrium at 4.1 mm.  Mirena IUD, since February 2018, is in good intrauterine position by ultrasound today.  A functional 2.5 cm simple cyst is present on the right ovary.  Left ovary has been surgically removed.  Patient reassured.  Will observe.  Counseling on above issues and coordination of care more than 50% for 15 minutes.   Genia Del MD, 10:46 AM 04/14/2018

## 2018-09-28 ENCOUNTER — Encounter: Payer: Self-pay | Admitting: Gynecology

## 2019-03-08 ENCOUNTER — Encounter: Payer: Self-pay | Admitting: Obstetrics & Gynecology

## 2019-03-09 ENCOUNTER — Encounter: Payer: Self-pay | Admitting: Obstetrics & Gynecology

## 2019-03-09 ENCOUNTER — Ambulatory Visit: Payer: BC Managed Care – PPO | Admitting: Obstetrics & Gynecology

## 2019-03-09 ENCOUNTER — Other Ambulatory Visit: Payer: Self-pay

## 2019-03-09 VITALS — BP 120/70 | Ht 65.0 in | Wt 175.0 lb

## 2019-03-09 DIAGNOSIS — Z30431 Encounter for routine checking of intrauterine contraceptive device: Secondary | ICD-10-CM

## 2019-03-09 DIAGNOSIS — Z01419 Encounter for gynecological examination (general) (routine) without abnormal findings: Secondary | ICD-10-CM | POA: Diagnosis not present

## 2019-03-09 NOTE — Progress Notes (Signed)
Lindsey Chan Feb 03, 1970 [id number]   History:    49 y.o. 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  DQ:[id number] presenting for annual gyn exam   HPI:On Mirena IUD x 02/2016 for cycle control of menorrhagia and dysmenorrhea. Has frequent BTB, but no heavy flow. No pelvic pain. Normal vaginal secretions. Urine/BMs wnl. Breasts wnl. BMI  29.12.  Enjoys walking. Health labs with [facility].    Past medical history,surgical history, family history and social history were all reviewed and documented in the EPIC chart.  Gynecologic History Patient's last menstrual period was 02/23/2019.  Obstetric History OB History  Gravida Para Term Preterm AB Living  2 2 2     2   SAB TAB Ectopic Multiple Live Births          2    # Outcome Date GA Lbr Len/2nd Weight Sex Delivery Anes PTL Lv  2 Term     F Vag-Spont  N LIV  1 Term     F Vag-Spont  N LIV     ROS: A ROS was performed and pertinent positives and negatives are included in the history.  GENERAL: No fevers or chills. HEENT: No change in vision, no earache, sore throat or sinus congestion. NECK: No pain or stiffness. CARDIOVASCULAR: No chest pain or pressure. No palpitations. PULMONARY: No shortness of breath, cough or wheeze. GASTROINTESTINAL: No abdominal pain, nausea, vomiting or diarrhea, melena or bright red blood per rectum. GENITOURINARY: No urinary frequency, urgency, hesitancy or dysuria. MUSCULOSKELETAL: No joint or muscle pain, no back pain, no recent trauma. DERMATOLOGIC: No rash, no itching, no lesions. ENDOCRINE: No polyuria, polydipsia, no heat or cold intolerance. No recent change in weight. HEMATOLOGICAL: No anemia or easy bruising or bleeding. NEUROLOGIC: No headache, seizures, numbness, tingling or weakness. PSYCHIATRIC: No depression, no loss of interest in normal activity or change in sleep pattern.     Exam:   BP 120/70   Ht 5\' 5"   (1.651 m)   Wt 175 lb (79.4 kg)   LMP 02/23/2019 Comment: MIRENA  02/2016  BMI 29.12 kg/m   Body mass index is 29.12 kg/m.  General appearance : Well developed well nourished female. No acute distress HEENT: Eyes: no retinal hemorrhage or exudates,  Neck supple, trachea midline, no carotid bruits, no thyroidmegaly Lungs: Clear to auscultation, no rhonchi or wheezes, or rib retractions  Heart: Regular rate and rhythm, no murmurs or gallops Breast:Examined in sitting and supine position were symmetrical in appearance, no palpable masses or tenderness,  no skin retraction, no nipple inversion, no nipple discharge, no skin discoloration, no axillary or supraclavicular lymphadenopathy Abdomen: no palpable masses or tenderness, no rebound or guarding Extremities: no edema or skin discoloration or tenderness  Pelvic: Vulva: Normal             Vagina: No gross lesions or discharge  Cervix: No gross lesions or discharge.  IUD strings visible at the EO.  Pap reflex done.  Uterus  AV, normal size, shape and consistency, non-tender and mobile  Adnexa  Without masses or tenderness  Anus: Normal   Assessment/Plan:  49 y.o. female for annual exam   1. Encounter for routine gynecological examination with Papanicolaou smear of cervix Normal gynecologic exam.  Pap reflex done.  Breast exam normal.  Screening mammogram done yesterday at [facility], normal per patient.  Lab work with integrative MD.  Recommend having a lipid panel done next visit.  Body  mass index 29.12.  Recommend a slightly lower calorie/carb diet.  Aerobic activities 5 times a week and light weightlifting every 2 days.  2. Encounter for routine checking of intrauterine contraceptive device (IUD) Mirena IUD since February 2018, well-tolerated and in good location.  Other orders - Zinc 100 MG TABS; Take by mouth. - cholecalciferol (VITAMIN D3) 25 MCG (1000 UNIT) tablet; Take 1,000 Units by mouth daily. - DHEA 10 MG CAPS; Take by mouth. -  Copper 5 MG CAPS; Take by mouth. - magnesium 30 MG tablet; Take 30 mg by mouth 2 (two) times daily. - Pap IG w/ reflex to HPV when ASC-U  Princess Bruins MD, 4:40 PM 03/09/2019

## 2019-03-14 LAB — PAP IG W/ RFLX HPV ASCU

## 2019-03-15 NOTE — Patient Instructions (Signed)
1. Encounter for routine gynecological examination with Papanicolaou smear of cervix Normal gynecologic exam.  Pap reflex done.  Breast exam normal.  Screening mammogram done yesterday at [facility], normal per patient.  Lab work with integrative MD.  Recommend having a lipid panel done next visit.  Body mass index 29.12.  Recommend a slightly lower calorie/carb diet.  Aerobic activities 5 times a week and light weightlifting every 2 days.  2. Encounter for routine checking of intrauterine contraceptive device (IUD) Mirena IUD since February 2018, well-tolerated and in good location.  Other orders - Zinc 100 MG TABS; Take by mouth. - cholecalciferol (VITAMIN D3) 25 MCG (1000 UNIT) tablet; Take 1,000 Units by mouth daily. - DHEA 10 MG CAPS; Take by mouth. - Copper 5 MG CAPS; Take by mouth. - magnesium 30 MG tablet; Take 30 mg by mouth 2 (two) times daily. - Pap IG w/ reflex to HPV when ASC-U  Lindsey Chan, it was a pleasure seeing you today!  I will inform you of your results as soon as they are available.

## 2019-08-08 ENCOUNTER — Emergency Department (HOSPITAL_COMMUNITY)
Admission: EM | Admit: 2019-08-08 | Discharge: 2019-08-08 | Disposition: A | Discharge status: Home / Self Care | Payer: BLUE CROSS/BLUE SHIELD | Attending: Emergency Medicine | Admitting: Emergency Medicine

## 2019-08-08 ENCOUNTER — Other Ambulatory Visit: Payer: Self-pay

## 2019-08-08 ENCOUNTER — Encounter (HOSPITAL_COMMUNITY): Payer: Self-pay

## 2019-08-08 DIAGNOSIS — R1030 Lower abdominal pain, unspecified: Secondary | ICD-10-CM | POA: Insufficient documentation

## 2019-08-08 DIAGNOSIS — Z5321 Procedure and treatment not carried out due to patient leaving prior to being seen by health care provider: Secondary | ICD-10-CM | POA: Insufficient documentation

## 2019-08-08 DIAGNOSIS — R197 Diarrhea, unspecified: Secondary | ICD-10-CM | POA: Insufficient documentation

## 2019-08-08 MED ORDER — SODIUM CHLORIDE 0.9% FLUSH: 3.0000 mL | Freq: Once | INTRAVENOUS | Status: DC

## 2019-08-08 NOTE — ED Triage Notes (Signed)
Patient reports that she is having lower abdominal cramping and multiple episodes of diarrhea this AM. Patient states she had a rash and itching of her palms afterwards.

## 2020-07-24 ENCOUNTER — Other Ambulatory Visit: Payer: Self-pay

## 2020-07-24 ENCOUNTER — Other Ambulatory Visit (HOSPITAL_COMMUNITY)
Admission: RE | Admit: 2020-07-24 | Discharge: 2020-07-24 | Disposition: A | Discharge status: Home / Self Care | Payer: Self-pay | Source: Ambulatory Visit | Attending: Obstetrics & Gynecology | Admitting: Obstetrics & Gynecology

## 2020-07-24 ENCOUNTER — Ambulatory Visit (INDEPENDENT_AMBULATORY_CARE_PROVIDER_SITE_OTHER): Payer: No Typology Code available for payment source | Admitting: Obstetrics & Gynecology

## 2020-07-24 ENCOUNTER — Encounter: Payer: Self-pay | Admitting: Obstetrics & Gynecology

## 2020-07-24 VITALS — BP 116/72 | HR 58 | Resp 16 | Ht 65.25 in | Wt 178.0 lb

## 2020-07-24 DIAGNOSIS — Z30431 Encounter for routine checking of intrauterine contraceptive device: Secondary | ICD-10-CM

## 2020-07-24 DIAGNOSIS — Z01419 Encounter for gynecological examination (general) (routine) without abnormal findings: Secondary | ICD-10-CM | POA: Diagnosis not present

## 2020-07-24 DIAGNOSIS — N898 Other specified noninflammatory disorders of vagina: Secondary | ICD-10-CM

## 2020-07-24 LAB — WET PREP FOR TRICH, YEAST, CLUE

## 2020-07-24 MED ORDER — FLUCONAZOLE 150 MG PO TABS
150.0000 mg | ORAL_TABLET | Freq: Every day | ORAL | 1 refills | Status: AC
Start: 2020-07-24 — End: 2020-07-27

## 2020-07-24 NOTE — Progress Notes (Signed)
Lindsey Chan 1970/04/11 [id number]   History:    50 y.o. 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   RP:  Established patient presenting for annual gyn exam   HPI: On Mirena IUD x 02/2016 for cycle control with light menses.  No pelvic pain.  Vaginal odor.  Urine/BMs wnl.  Breasts wnl.  BMI  29.39  .  Enjoys walking. Health labs with [facility].  On Pregnenolone.    Past medical history,surgical history, family history and social history were all reviewed and documented in the EPIC chart.  Gynecologic History No LMP recorded. (Menstrual status: IUD).  Obstetric History OB History  Gravida Para Term Preterm AB Living  2 2 2     2   SAB IAB Ectopic Multiple Live Births          2    # Outcome Date GA Lbr Len/2nd Weight Sex Delivery Anes PTL Lv  2 Term     F Vag-Spont  N LIV  1 Term     F Vag-Spont  N LIV     ROS: A ROS was performed and pertinent positives and negatives are included in the history.  GENERAL: No fevers or chills. HEENT: No change in vision, no earache, sore throat or sinus congestion. NECK: No pain or stiffness. CARDIOVASCULAR: No chest pain or pressure. No palpitations. PULMONARY: No shortness of breath, cough or wheeze. GASTROINTESTINAL: No abdominal pain, nausea, vomiting or diarrhea, melena or bright red blood per rectum. GENITOURINARY: No urinary frequency, urgency, hesitancy or dysuria. MUSCULOSKELETAL: No joint or muscle pain, no back pain, no recent trauma. DERMATOLOGIC: No rash, no itching, no lesions. ENDOCRINE: No polyuria, polydipsia, no heat or cold intolerance. No recent change in weight. HEMATOLOGICAL: No anemia or easy bruising or bleeding. NEUROLOGIC: No headache, seizures, numbness, tingling or weakness. PSYCHIATRIC: No depression, no loss of interest in normal activity or change in sleep pattern.     Exam:   BP 116/72   Pulse (!) 58   Resp 16   Ht 5' 5.25" (1.657 m)    Wt 178 lb (80.7 kg)   BMI 29.39 kg/m   Body mass index is 29.39 kg/m.  General appearance : Well developed well nourished female. No acute distress HEENT: Eyes: no retinal hemorrhage or exudates,  Neck supple, trachea midline, no carotid bruits, no thyroidmegaly Lungs: Clear to auscultation, no rhonchi or wheezes, or rib retractions  Heart: Regular rate and rhythm, no murmurs or gallops Breast:Examined in sitting and supine position were symmetrical in appearance, no palpable masses or tenderness,  no skin retraction, no nipple inversion, no nipple discharge, no skin discoloration, no axillary or supraclavicular lymphadenopathy Abdomen: no palpable masses or tenderness, no rebound or guarding Extremities: no edema or skin discoloration or tenderness  Pelvic: Vulva: Normal             Vagina: No gross lesions.  Mild discharge.  Wet prep done.  Uterus  AV, normal size, shape and consistency, non-tender and mobile             Cervix:  Normal.  Non tender.  IUD strings visible at the EO.  Pap reflex done.  Adnexa  Without masses or tenderness             Anus: Normal  Wet prep: Yeasts present   Assessment/Plan:  50 y.o. female for annual exam   1. Encounter for routine gynecological examination  with Papanicolaou smear of cervix Normal gynecologic exam.  Pap test done.  Breast exam normal.  Screening mammogram April 2022 was negative.  Schedule colonoscopy.  Health labs with family physician.  Body mass index 29.39.  Continue with fitness and healthy nutrition. - Cytology - PAP( [facility])  2. Encounter for routine checking of intrauterine contraceptive device (IUD) Well on Mirena IUD since February 2018.  IUD in good position.  3. Vaginal discharge Yeast vaginitis confirmed by wet prep.  Will treat with fluconazole.  Usage reviewed and prescription sent to pharmacy. - WET PREP FOR TRICH, YEAST, CLUE  Other orders - levonorgestrel (MIRENA) 20 MCG/DAY IUD; 1 each by Intrauterine  route once. - fluconazole (DIFLUCAN) 150 MG tablet; Take 1 tablet (150 mg total) by mouth daily for 3 days.   Genia Del MD, 3:27 PM 07/24/2020

## 2020-07-26 LAB — CYTOLOGY - PAP: Diagnosis: NEGATIVE

## 2020-07-27 ENCOUNTER — Encounter: Payer: Self-pay | Admitting: Obstetrics & Gynecology

## 2021-05-13 ENCOUNTER — Encounter: Payer: Self-pay | Admitting: Obstetrics & Gynecology

## 2021-06-17 ENCOUNTER — Telehealth: Payer: Self-pay

## 2021-06-17 NOTE — Telephone Encounter (Signed)
We received letter from [facility] that patient is in need of follow up from her 05/09/21 screening mammogram and they have attempted to reach her and asked for our assistance. I called patient and left message in voice mail that [facility] trying to reach her because they want to do some additional imaging/poss u/s related to her screening mammo. I left my direct phone number but also provided her the [facility] phone number so that she can call to schedule.

## 2021-07-04 ENCOUNTER — Encounter: Payer: Self-pay | Admitting: Obstetrics & Gynecology

## 2021-07-16 NOTE — Telephone Encounter (Signed)
Patient had the follow up imaging performed 07/04/21. All negative.

## 2021-07-25 ENCOUNTER — Ambulatory Visit: Payer: No Typology Code available for payment source | Admitting: Obstetrics & Gynecology

## 2021-08-11 ENCOUNTER — Encounter: Payer: Self-pay | Admitting: Obstetrics & Gynecology

## 2021-08-11 ENCOUNTER — Ambulatory Visit (INDEPENDENT_AMBULATORY_CARE_PROVIDER_SITE_OTHER): Payer: BC Managed Care – PPO | Admitting: Obstetrics & Gynecology

## 2021-08-11 VITALS — BP 108/68 | Ht 65.25 in | Wt 178.0 lb

## 2021-08-11 DIAGNOSIS — Z9189 Other specified personal risk factors, not elsewhere classified: Secondary | ICD-10-CM

## 2021-08-11 DIAGNOSIS — Z30431 Encounter for routine checking of intrauterine contraceptive device: Secondary | ICD-10-CM

## 2021-08-11 DIAGNOSIS — Z01419 Encounter for gynecological examination (general) (routine) without abnormal findings: Secondary | ICD-10-CM | POA: Diagnosis not present

## 2021-08-11 NOTE — Progress Notes (Signed)
Lindsey Chan November 16, 1970 [id number]   History:    51 y.o. G2P2L2 Married.  Husband Vasectomized.  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  Daughters 78 yo  and 85 yo graduated from [employer].   RP:  Established patient presenting for annual gyn exam   HPI: On Mirena IUD x 02/2016 for cycle control with light menses.  No pelvic pain.  No h/o abnormal Pap.  Pap Neg 07/2020.  Will repeat Pap at 2 years.  Urine/BMs wnl.  Colono to schedule. Breasts wnl.  Mammo 05/2021, Rt Dx/US 06/2021.  BMI  29.39.  Enjoys walking. Health labs with [facility].  On Pregnenolone.    Past medical history,surgical history, family history and social history were all reviewed and documented in the EPIC chart.  Gynecologic History No LMP recorded. (Menstrual status: IUD).  Obstetric History OB History  Gravida Para Term Preterm AB Living  2 2 2     2   SAB IAB Ectopic Multiple Live Births          2    # Outcome Date GA Lbr Len/2nd Weight Sex Delivery Anes PTL Lv  2 Term     F Vag-Spont  N LIV  1 Term     F Vag-Spont  N LIV     ROS: A ROS was performed and pertinent positives and negatives are included in the history. GENERAL: No fevers or chills. HEENT: No change in vision, no earache, sore throat or sinus congestion. NECK: No pain or stiffness. CARDIOVASCULAR: No chest pain or pressure. No palpitations. PULMONARY: No shortness of breath, cough or wheeze. GASTROINTESTINAL: No abdominal pain, nausea, vomiting or diarrhea, melena or bright red blood per rectum. GENITOURINARY: No urinary frequency, urgency, hesitancy or dysuria. MUSCULOSKELETAL: No joint or muscle pain, no back pain, no recent trauma. DERMATOLOGIC: No rash, no itching, no lesions. ENDOCRINE: No polyuria, polydipsia, no heat or cold intolerance. No recent change in weight. HEMATOLOGICAL: No anemia or easy bruising or bleeding. NEUROLOGIC: No headache, seizures, numbness, tingling or weakness. PSYCHIATRIC: No depression, no loss of interest in normal  activity or change in sleep pattern.     Exam:   BP 108/68 (BP Location: Left Arm, Patient Position: Sitting, Cuff Size: Normal)   Ht 5' 5.25" (1.657 m)   Wt 178 lb (80.7 kg)   BMI 29.39 kg/m   Body mass index is 29.39 kg/m.  General appearance : Well developed well nourished female. No acute distress HEENT: Eyes: no retinal hemorrhage or exudates,  Neck supple, trachea midline, no carotid bruits, no thyroidmegaly Lungs: Clear to auscultation, no rhonchi or wheezes, or rib retractions  Heart: Regular rate and rhythm, no murmurs or gallops Breast:Examined in sitting and supine position were symmetrical in appearance, no palpable masses or tenderness,  no skin retraction, no nipple inversion, no nipple discharge, no skin discoloration, no axillary or supraclavicular lymphadenopathy Abdomen: no palpable masses or tenderness, no rebound or guarding Extremities: no edema or skin discoloration or tenderness  Pelvic: Vulva: Normal             Vagina: No gross lesions or discharge  Cervix: No gross lesions or discharge.  IUD strings felt at [facility].  Uterus  AV, normal size, shape and consistency, non-tender and mobile  Adnexa  Without masses or tenderness  Anus: Normal   Assessment/Plan:  51 y.o. female for annual exam   1. Well female exam with routine gynecological exam On Mirena IUD x 02/2016 for cycle control with light menses.  No  pelvic pain.  No h/o abnormal Pap.  Pap Neg 07/2020.  Will repeat Pap at 3 years.  Urine/BMs wnl.  Colono to schedule. Breasts wnl. Mammo 05/2021, Rt Dx/US 06/2021.  BMI  29.39.  Enjoys walking. Health labs with [facility].  On Pregnenolone.   2. Encounter for routine checking of intrauterine contraceptive device (IUD) On Mirena IUD x 02/2016 for cycle control with light menses.  No pelvic pain.  IUD in good position.  3. Relies on partner vasectomy for contraception  Other orders - Cyanocobalamin (B-12 PO); Take by mouth. - Omega-3 Fatty Acids (OMEGA-3  FISH OIL PO); Take by mouth. - NON FORMULARY; Pregnelolone po - Loratadine (CLARITIN PO); Take by mouth.   Genia Del MD, 2:19 PM 08/11/2021

## 2022-05-25 ENCOUNTER — Encounter: Payer: Self-pay | Admitting: Obstetrics & Gynecology

## 2022-08-14 ENCOUNTER — Ambulatory Visit: Payer: BC Managed Care – PPO | Admitting: Obstetrics & Gynecology

## 2022-09-11 ENCOUNTER — Ambulatory Visit (INDEPENDENT_AMBULATORY_CARE_PROVIDER_SITE_OTHER): Payer: BC Managed Care – PPO | Admitting: Radiology

## 2022-09-11 ENCOUNTER — Encounter: Payer: Self-pay | Admitting: Radiology

## 2022-09-11 VITALS — BP 130/86 | HR 56 | Ht 66.0 in | Wt 182.0 lb

## 2022-09-11 DIAGNOSIS — Z01419 Encounter for gynecological examination (general) (routine) without abnormal findings: Secondary | ICD-10-CM

## 2022-09-11 DIAGNOSIS — Z30432 Encounter for removal of intrauterine contraceptive device: Secondary | ICD-10-CM

## 2022-09-11 NOTE — Progress Notes (Signed)
   Lindsey Chan [birth year] [id number]   History:  51 y.o. G2P2 presents for annual exam. Requests IUD removal, states  it was recommended by her other doctor because she was likely menopausal. No record of hormonal labs. Mirena was inserted in 2018. Consent signed for removal.  Gynecologic History No LMP recorded. (Menstrual status: IUD).   Contraception/Family planning: IUD and vasectomy Sexually active: yes Last Pap: 2022. Results were: normal Last mammogram: 5/24. Results were: normal  Obstetric History OB History  Gravida Para Term Preterm AB Living  2 2 2     2   SAB IAB Ectopic Multiple Live Births          2    # Outcome Date GA Lbr Len/2nd Weight Sex Type Anes PTL Lv  2 Term     F Vag-Spont  N LIV  1 Term     F Vag-Spont  N LIV     The following portions of the patient's history were reviewed and updated as appropriate: allergies, current medications, past family history, past medical history, past social history, past surgical history, and problem list.  Review of Systems Pertinent items noted in HPI and remainder of comprehensive ROS otherwise negative.   Past medical history, past surgical history, family history and social history were all reviewed and documented in the EPIC chart.   Exam:  There were no vitals filed for this visit. There is no height or weight on file to calculate BMI.  General appearance:  Normal Thyroid:  Symmetrical, normal in size, without palpable masses or nodularity. Respiratory  Auscultation:  Clear without wheezing or rhonchi Cardiovascular  Auscultation:  Regular rate, without rubs, murmurs or gallops  Edema/varicosities:  Not grossly evident Abdominal  Soft,nontender, without masses, guarding or rebound.  Liver/spleen:  No organomegaly noted  Hernia:  None appreciated  Skin  Inspection:  Grossly normal Breasts: Examined lying and sitting.   Right: Without masses, retractions, nipple discharge or axillary  adenopathy.   Left: Without masses, retractions, nipple discharge or axillary adenopathy. Genitourinary   Inguinal/mons:  Normal without inguinal adenopathy  External genitalia:  Normal appearing vulva with no masses, tenderness, or lesions  BUS/Urethra/Skene's glands:  Normal without masses or exudate  Vagina:  Normal appearing with normal color and discharge, no lesions  Cervix:  Normal appearing without discharge or lesions  Uterus:  Normal in size, shape and contour.  Mobile, nontender  Adnexa/parametria:     Rt: Normal in size, without masses or tenderness.   Lt: Absent  Anus and perineum: Normal   Kristin Bruins, CMA present for exam  Assessment/Plan:   1. Well woman exam with routine gynecological exam Pap due 2025 Up to date with screenings Has a PCP  2. Encounter for IUD removal IUD removed despite recommendation to leave for endometrial protection.    Discussed SBE, mammogram and pap screening. Discussed the importance of endometrial protection with progesterone if using ERT for menopausal symptoms to prevent endometrial cancer.  Call with any irregular bleeding Return in 1 year for annual or as needed.   Arlie Solomons B WHNP-BC 4:05 PM 09/11/2022

## 2023-06-23 LAB — HM MAMMOGRAPHY

## 2023-06-24 ENCOUNTER — Encounter: Payer: Self-pay | Admitting: Obstetrics and Gynecology

## 2023-06-24 ENCOUNTER — Ambulatory Visit: Payer: Self-pay | Admitting: Obstetrics and Gynecology

## 2023-06-29 NOTE — Telephone Encounter (Signed)
 Spoke with patient.  No recent PAP smear, last pap 07/2018.  Per review of chart, MMG at [facility] 06/24/23, advised of normal results. Advised of recommendations for BMD per Dr. Glennon. Patient confused, would prefer to wait until next AEX and further discuss recommendations with Jami. No orders placed. I apologized for the confusion. No future AEX scheduled. Patient verbalizes understanding.   Routing FYI.   Cc: Jami

## 2023-06-29 NOTE — Progress Notes (Signed)
 Please schedule AEX with me. Thank you!

## 2023-09-22 ENCOUNTER — Ambulatory Visit: Payer: Self-pay | Admitting: Radiology
